# Patient Record
Sex: Male | Born: 1960
Health system: Southern US, Community
[De-identification: ages and names within clinical notes are randomized; demographics above are authoritative.]

## PROBLEM LIST (undated history)

## (undated) DIAGNOSIS — F419 Anxiety disorder, unspecified: Secondary | ICD-10-CM

## (undated) DIAGNOSIS — M549 Dorsalgia, unspecified: Secondary | ICD-10-CM

## (undated) DIAGNOSIS — I10 Essential (primary) hypertension: Secondary | ICD-10-CM

## (undated) HISTORY — DX: Dorsalgia, unspecified: M54.9

## (undated) HISTORY — DX: Essential (primary) hypertension: I10

---

## 1984-11-21 HISTORY — PX: VASECTOMY: SHX75

## 1988-11-21 HISTORY — PX: HERNIA REPAIR: SHX51

## 2006-06-26 ENCOUNTER — Encounter: Admission: RE | Admit: 2006-06-26 | Discharge: 2006-06-26 | Payer: Self-pay | Admitting: Occupational Medicine

## 2006-11-21 DIAGNOSIS — M549 Dorsalgia, unspecified: Secondary | ICD-10-CM

## 2006-11-21 HISTORY — DX: Dorsalgia, unspecified: M54.9

## 2006-11-21 HISTORY — PX: BACK SURGERY: SHX140

## 2006-12-20 ENCOUNTER — Inpatient Hospital Stay (HOSPITAL_COMMUNITY): Admission: RE | Admit: 2006-12-20 | Discharge: 2006-12-25 | Payer: Self-pay | Admitting: Orthopedic Surgery

## 2006-12-28 ENCOUNTER — Inpatient Hospital Stay (HOSPITAL_COMMUNITY): Admission: EM | Admit: 2006-12-28 | Discharge: 2007-01-01 | Payer: Self-pay | Admitting: Emergency Medicine

## 2006-12-29 ENCOUNTER — Ambulatory Visit: Payer: Self-pay | Admitting: Internal Medicine

## 2008-08-04 IMAGING — CR DG LUMBAR SPINE 2-3V
1 series · 1 of 1 positions shown · non-contrast
Comparison: none

CLINICAL DATA: Spondylosis.  Intraoperative view.
 LUMBAR SPINE ? 1 VIEW:

[view not recorded]
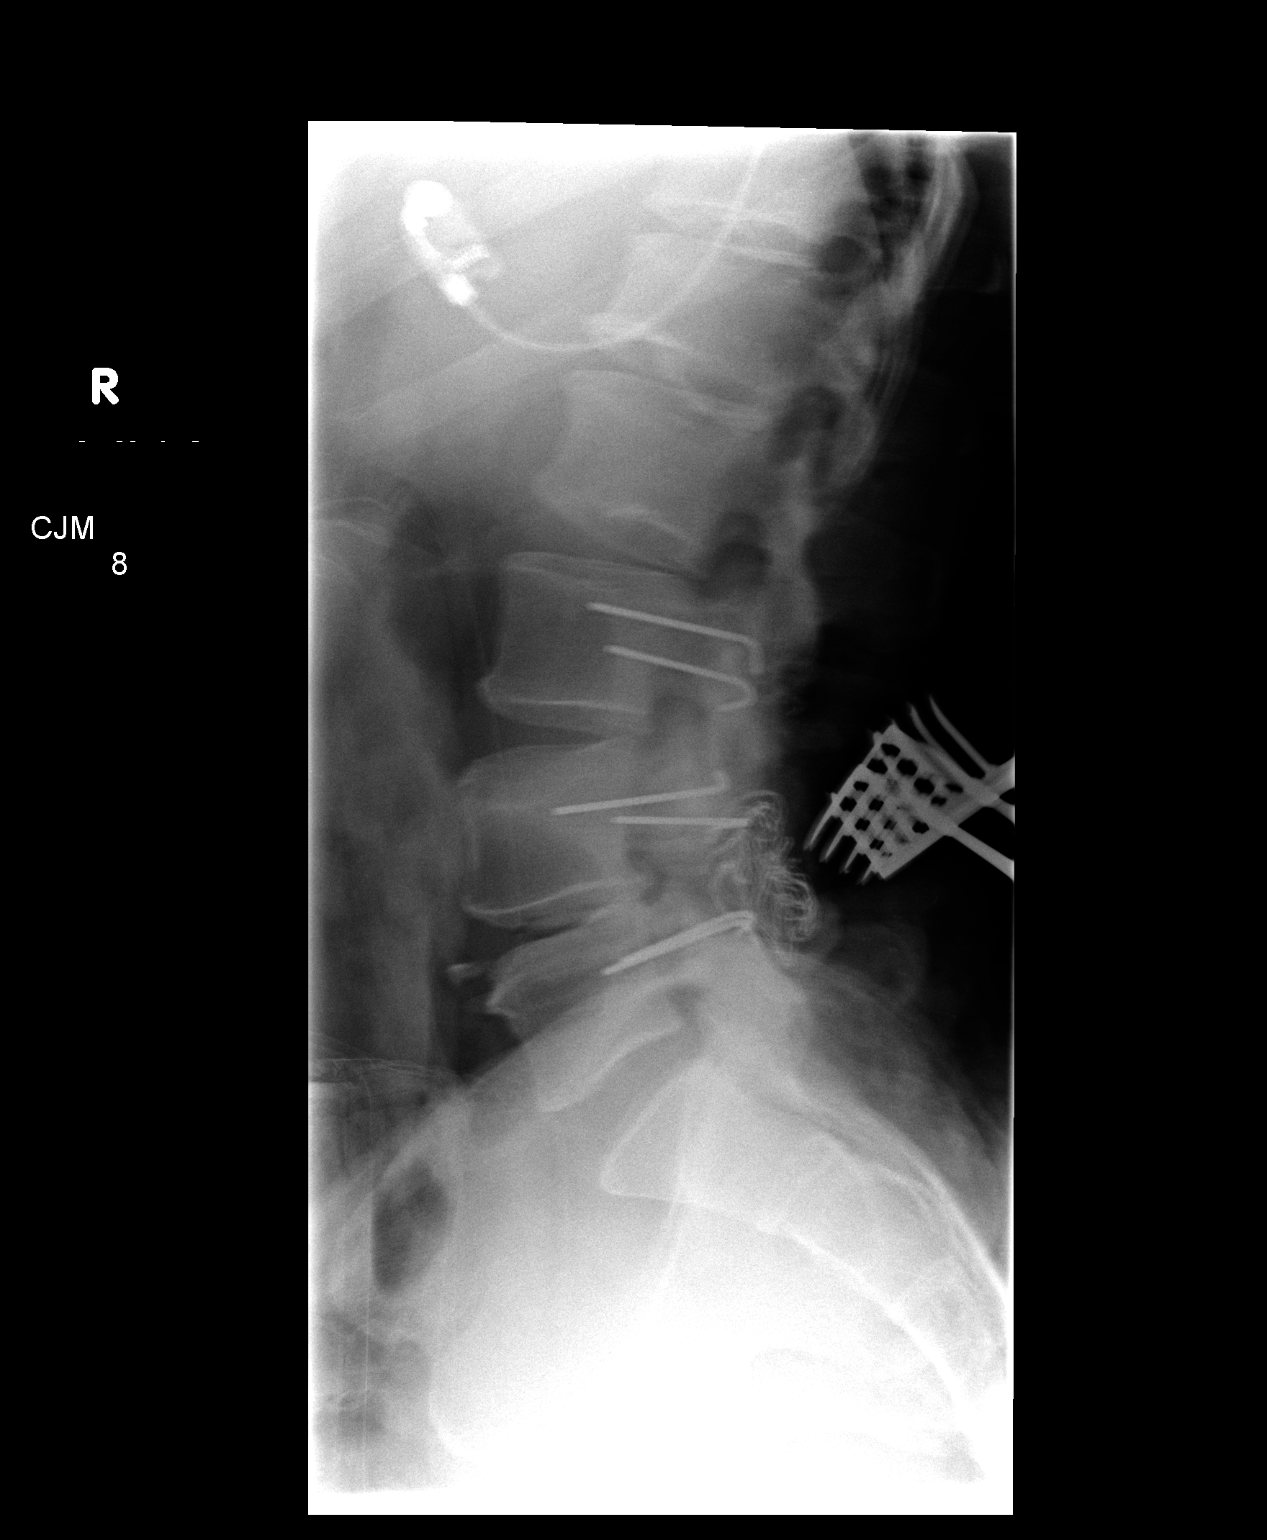

[1 of 1 positions shown; findings below may reference images not displayed]

FINDINGS: The study is interpreted, accounting for 5 non rib-bearing lumbar vertebrae.  The last measured disk was denoted as L5-S1.  Metallic marker seen posteriorly at the L4-5 disk level with skin spreaders posteriorly.
IMPRESSION: Probe at L4-5.

## 2008-08-06 IMAGING — CR DG CHEST 1V PORT
1 series · 1 of 1 positions shown · non-contrast
Comparison: none

CLINICAL DATA: Postoperative fever in patient with cervical spondylosis.
 PORTABLE CHEST - 1 VIEW:

[view not recorded]
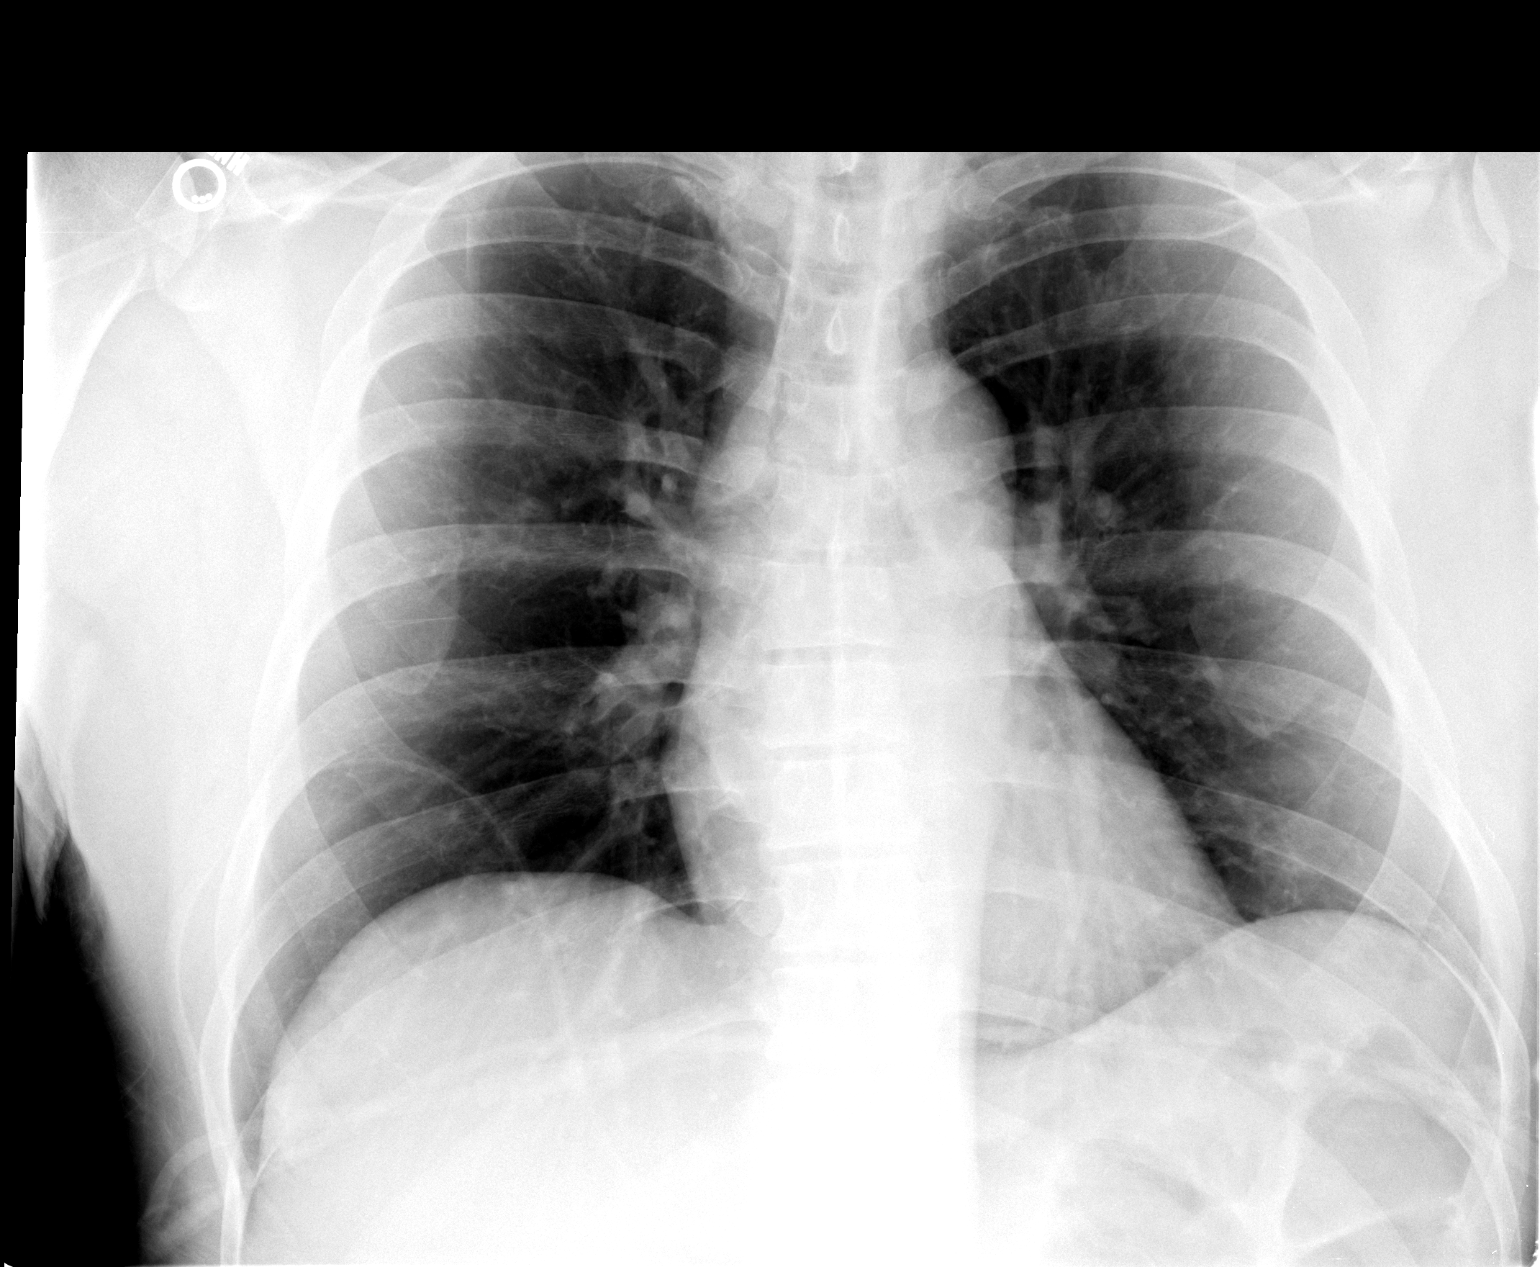

[1 of 1 positions shown; findings below may reference images not displayed]

FINDINGS: Lungs are clear.  No effusion.  Heart size normal.
IMPRESSION: No acute disease.

## 2013-04-27 DIAGNOSIS — M47817 Spondylosis without myelopathy or radiculopathy, lumbosacral region: Secondary | ICD-10-CM | POA: Insufficient documentation

## 2013-04-27 DIAGNOSIS — M5137 Other intervertebral disc degeneration, lumbosacral region: Secondary | ICD-10-CM | POA: Insufficient documentation

## 2014-02-19 LAB — HEPATIC FUNCTION PANEL
ALT: 17 U/L (ref 10–40)
AST: 17 U/L (ref 14–40)

## 2014-02-19 LAB — PSA: PSA: 0.3

## 2014-09-29 LAB — BASIC METABOLIC PANEL
BUN: 23 mg/dL — AB (ref 4–21)
CREATININE: 1.2 mg/dL (ref 0.6–1.3)
GLUCOSE: 143 mg/dL
Potassium: 4.7 mmol/L (ref 3.4–5.3)
Sodium: 138 mmol/L (ref 137–147)

## 2015-04-08 LAB — HEMOGLOBIN A1C: Hgb A1c MFr Bld: 5.6 % (ref 4.0–6.0)

## 2015-05-28 ENCOUNTER — Other Ambulatory Visit: Payer: Self-pay | Admitting: Family Medicine

## 2015-09-21 DIAGNOSIS — G8929 Other chronic pain: Secondary | ICD-10-CM | POA: Insufficient documentation

## 2015-09-21 DIAGNOSIS — F419 Anxiety disorder, unspecified: Secondary | ICD-10-CM | POA: Insufficient documentation

## 2015-09-21 DIAGNOSIS — Z8659 Personal history of other mental and behavioral disorders: Secondary | ICD-10-CM | POA: Insufficient documentation

## 2015-09-21 DIAGNOSIS — M549 Dorsalgia, unspecified: Secondary | ICD-10-CM

## 2015-09-21 DIAGNOSIS — I10 Essential (primary) hypertension: Secondary | ICD-10-CM | POA: Insufficient documentation

## 2015-09-21 DIAGNOSIS — R7303 Prediabetes: Secondary | ICD-10-CM | POA: Insufficient documentation

## 2015-09-21 DIAGNOSIS — J302 Other seasonal allergic rhinitis: Secondary | ICD-10-CM | POA: Insufficient documentation

## 2015-09-22 ENCOUNTER — Ambulatory Visit (INDEPENDENT_AMBULATORY_CARE_PROVIDER_SITE_OTHER): Payer: Medicare Other | Admitting: Family Medicine

## 2015-09-22 ENCOUNTER — Encounter: Payer: Self-pay | Admitting: Family Medicine

## 2015-09-22 VITALS — BP 120/88 | HR 64 | Temp 98.4°F | Resp 16 | Ht 68.0 in | Wt 190.0 lb

## 2015-09-22 DIAGNOSIS — G8929 Other chronic pain: Secondary | ICD-10-CM | POA: Diagnosis not present

## 2015-09-22 DIAGNOSIS — Z1211 Encounter for screening for malignant neoplasm of colon: Secondary | ICD-10-CM | POA: Diagnosis not present

## 2015-09-22 DIAGNOSIS — J302 Other seasonal allergic rhinitis: Secondary | ICD-10-CM

## 2015-09-22 DIAGNOSIS — R7303 Prediabetes: Secondary | ICD-10-CM | POA: Diagnosis not present

## 2015-09-22 DIAGNOSIS — Z0000 Encounter for general adult medical examination without abnormal findings: Secondary | ICD-10-CM

## 2015-09-22 DIAGNOSIS — M549 Dorsalgia, unspecified: Secondary | ICD-10-CM

## 2015-09-22 DIAGNOSIS — F419 Anxiety disorder, unspecified: Secondary | ICD-10-CM

## 2015-09-22 DIAGNOSIS — I10 Essential (primary) hypertension: Secondary | ICD-10-CM | POA: Diagnosis not present

## 2015-09-22 LAB — IFOBT (OCCULT BLOOD): IMMUNOLOGICAL FECAL OCCULT BLOOD TEST: NEGATIVE

## 2015-09-22 NOTE — Progress Notes (Signed)
Patient: Danny ChapelKeith E Pham, Male    DOB: [birth year], 54 y.o.   MRN: [id number] Visit Date: 09/22/2015  Today's Provider: Mila Merryonald Fisher, MD   Chief Complaint  Patient presents with  . Medicare Wellness  . Hypertension  . Anxiety   Subjective:    Annual wellness visit Danny ChapelKeith E Krontz is a 54 y.o. male. He feels well. He reports exercising yes. He reports he is sleeping well.  -----------------------------------------------------------   Follow-up for chronic anxiety from 04/08/2015; no changes. Follow-up for pre-diabetes from 04/07/2017; no changes.   Hypertension, follow-up:  BP Readings from Last 3 Encounters:  09/22/15 120/88  04/08/15 130/80    He was last seen for hypertension 5 months ago.  BP at that visit was 130/80. Management since that visit includes none. He reports good compliance with treatment. He is not having side effects. none  He is exercising. He is not adherent to low salt diet.   Outside blood pressures are 120/80. He is experiencing none.  Patient denies none.   Cardiovascular risk factors include pre-diabetes.  Use of agents associated with hypertension: none.     Weight trend: stable Wt Readings from Last 3 Encounters:  09/22/15 190 lb (86.183 kg)  04/08/15 185 lb (83.915 kg)    Current diet: well balanced  ----------------------------------------------------------------------    Review of Systems  Constitutional: Negative.   HENT: Negative.   Eyes: Negative.   Respiratory: Negative.   Cardiovascular: Negative.   Gastrointestinal: Negative.   Endocrine: Negative.   Genitourinary: Negative.   Musculoskeletal: Positive for back pain.  Skin: Negative.   Allergic/Immunologic: Negative.   Neurological: Negative.   Hematological: Negative.   Psychiatric/Behavioral: Negative.     Social History   Social History  . Marital Status: Married    Spouse Name: N/A  . Number of Children: 1  . Years of Education: N/A    Occupational History  . Disabled     Due to back pain   Social History Main Topics  . Smoking status: Never Smoker   . Smokeless tobacco: Not on file  . Alcohol Use: No  . Drug Use: No  . Sexual Activity: Not on file   Other Topics Concern  . Not on file   Social History Narrative    Patient Active Problem List   Diagnosis Date Noted  . Allergic rhinitis, seasonal 09/21/2015  . Chronic back pain 09/21/2015  . Chronic anxiety 09/21/2015  . Hypertension 09/21/2015  . Prediabetes 09/21/2015    Past Surgical History  Procedure Laterality Date  . Back surgery  2008  . Hernia repair  1980  . Vasectomy  1980    His family history includes CAD in his other; COPD in his mother; Congestive Heart Failure in his mother; Diabetes in his father and mother; Hypertension in his father and mother.    Previous Medications   ALPRAZOLAM (XANAX) 1 MG TABLET    Take 0.5-1 tablets by mouth 3 (three) times daily.   CYCLOBENZAPRINE (FLEXERIL) 10 MG TABLET    Take 0.5 tablets by mouth daily as needed.   HYDROCHLOROTHIAZIDE (HYDRODIURIL) 25 MG TABLET    TAKE 1 TABLET BY MOUTH EVERY DAY   MONTELUKAST (SINGULAIR) 10 MG TABLET    Take 1 tablet by mouth daily. For allergies   MORPHINE (MS CONTIN) 30 MG 12 HR TABLET    Take 1 tablet by mouth every 12 (twelve) hours.   OXYCODONE-ACETAMINOPHEN (PERCOCET) 10-325 MG TABLET    Take 1  tablet by mouth daily.   RAMIPRIL (ALTACE) 10 MG CAPSULE    Take 1 capsule by mouth daily.    Patient Care Team: Malva Limes, MD as PCP - General (Family Medicine) Malva Limes, MD as Referring Physician (Family Medicine) Ellwood Sayers, MD as Referring Physician (Physical Medicine and Rehabilitation)     Objective:   Vitals: BP 120/88 mmHg  Pulse 64  Temp(Src) 98.4 F (36.9 C) (Oral)  Resp 16  Ht  (1.727 m)  Wt 190 lb (86.183 kg)  BMI 28.90 kg/m2  SpO2 94%  Physical Exam   General Appearance:    Alert, cooperative, no distress, appears  stated age  Head:    Normocephalic, without obvious abnormality, atraumatic  Eyes:    PERRL, conjunctiva/corneas clear, EOM's intact, fundi    benign, both eyes       Ears:    Normal TM's and external ear canals, both ears  Nose:   Nares normal, septum midline, mucosa normal, no drainage   or sinus tenderness  Throat:   Lips, mucosa, and tongue normal; teeth and gums normal  Neck:   Supple, symmetrical, trachea midline, no adenopathy;       thyroid:  No enlargement/tenderness/nodules; no carotid   bruit or JVD  Back:     Symmetric, no curvature, ROM normal, no CVA tenderness  Lungs:     Clear to auscultation bilaterally, respirations unlabored  Chest wall:    No tenderness or deformity  Heart:    Regular rate and rhythm, S1 and S2 normal, no murmur, rub   or gallop  Abdomen:     Soft, non-tender, bowel sounds active all four quadrants,    no masses, no organomegaly  Genitalia:    deferred  Rectal:    normal tone, normal prostate, no masses or tenderness  Extremities:   Extremities normal, atraumatic, no cyanosis or edema  Pulses:   2+ and symmetric all extremities  Skin:   Skin color, texture, turgor normal, no rashes or lesions  Lymph nodes:   Cervical, supraclavicular, and axillary nodes normal  Neurologic:   CNII-XII intact. Normal strength, sensation and reflexes      throughout    Activities of Daily Living In your present state of health, do you have any difficulty performing the following activities: 09/22/2015  Hearing? N  Vision? N  Difficulty concentrating or making decisions? N  Walking or climbing stairs? Y  Dressing or bathing? N  Doing errands, shopping? N    Fall Risk Assessment Fall Risk  09/22/2015  Falls in the past year? No     Depression Screen PHQ 2/9 Scores 09/22/2015  PHQ - 2 Score 0  PHQ- 9 Score 0    Audit-C Alcohol Use Screening  Question Answer Points  How often do you have alcoholic drink? never 0  On days you do drink alcohol, how many  drinks do you typically consume? n/a 0  How oftey will you drink 6 or more in a total? never 0  Total Score:  0   A score of 3 or more in women, and 4 or more in men indicates increased risk for alcohol abuse, EXCEPT if all of the points are from question 1.  Results for orders performed in visit on 09/22/15  IFOBT POC (occult bld, rslt in office)  Result Value Ref Range   IFOBT Negative       Assessment & Plan:     Annual Wellness Visit  Reviewed patient's Family  Medical History Reviewed and updated list of patient's medical providers Assessment of cognitive impairment was done Assessed patient's functional ability Established a written schedule for health screening services Health Risk Assessent Completed and Reviewed  Exercise Activities and Dietary recommendations Goals    None      Immunization History  Administered Date(s) Administered  . Tdap 02/12/2013    Health Maintenance  Topic Date Due  . HIV Screening  06/30/1976  . COLONOSCOPY  iFOBT today  . INFLUENZA VACCINE  09/21/2016 (Originally 06/22/2015)  . TETANUS/TDAP  02/13/2023  . Hepatitis C Screening  Completed      Discussed health benefits of physical activity, and encouraged him to engage in regular exercise appropriate for his age and condition.    ------------------------------------------------------------------------------------------------------------ 1. Annual physical exam Doing well unremarkable exam - PSA - Lipid panel - EKG 12-Lead  2. Allergic rhinitis, seasonal Doing well with singulair  3. Chronic anxiety Doing well with prn anxiety  4. Chronic back pain Continue follow up Dr. Percell Boston  5. Essential hypertension Well controlled.  Continue current medications.    - Renal function panel - EKG 12-Lead  6. Prediabetes  - Hemoglobin A1c  7. Colon cancer screening  - IFOBT POC (occult bld, rslt in office)

## 2015-09-23 ENCOUNTER — Other Ambulatory Visit: Payer: Self-pay | Admitting: Family Medicine

## 2015-09-23 LAB — RENAL FUNCTION PANEL
ALBUMIN: 4.8 g/dL (ref 3.5–5.5)
BUN / CREAT RATIO: 14 (ref 9–20)
BUN: 13 mg/dL (ref 6–24)
CHLORIDE: 98 mmol/L (ref 97–106)
CO2: 27 mmol/L (ref 18–29)
Calcium: 9.7 mg/dL (ref 8.7–10.2)
Creatinine, Ser: 0.95 mg/dL (ref 0.76–1.27)
GFR calc non Af Amer: 90 mL/min/{1.73_m2} (ref >59)
GFR, EST AFRICAN AMERICAN: 104 mL/min/{1.73_m2} (ref >59)
GLUCOSE: 128 mg/dL — AB (ref 65–99)
POTASSIUM: 4.6 mmol/L (ref 3.5–5.2)
Phosphorus: 2.3 mg/dL — ABNORMAL LOW (ref 2.5–4.5)
Sodium: 141 mmol/L (ref 136–144)

## 2015-09-23 LAB — LIPID PANEL
Chol/HDL Ratio: 2.8 ratio units (ref 0.0–5.0)
Cholesterol, Total: 174 mg/dL (ref 100–199)
HDL: 63 mg/dL (ref >39)
LDL Calculated: 55 mg/dL (ref 0–99)
Triglycerides: 279 mg/dL — ABNORMAL HIGH (ref 0–149)
VLDL Cholesterol Cal: 56 mg/dL — ABNORMAL HIGH (ref 5–40)

## 2015-09-23 LAB — HEMOGLOBIN A1C
ESTIMATED AVERAGE GLUCOSE: 126 mg/dL
HEMOGLOBIN A1C: 6 % — AB (ref 4.8–5.6)

## 2015-09-23 LAB — PSA: Prostate Specific Ag, Serum: 0.3 ng/mL (ref 0.0–4.0)

## 2015-09-24 ENCOUNTER — Encounter: Payer: Self-pay | Admitting: Family Medicine

## 2015-10-16 ENCOUNTER — Encounter: Payer: Self-pay | Admitting: Family Medicine

## 2015-10-21 ENCOUNTER — Other Ambulatory Visit: Payer: Self-pay | Admitting: Family Medicine

## 2015-10-21 NOTE — Telephone Encounter (Signed)
Please call in alprazolam.  

## 2015-10-22 ENCOUNTER — Other Ambulatory Visit: Payer: Self-pay | Admitting: Family Medicine

## 2015-10-23 NOTE — Telephone Encounter (Signed)
Rx called in to pharmacy. 

## 2016-02-28 ENCOUNTER — Other Ambulatory Visit: Payer: Self-pay | Admitting: Family Medicine

## 2016-02-29 NOTE — Telephone Encounter (Signed)
Please call in alprazolam.  

## 2016-02-29 NOTE — Telephone Encounter (Signed)
Rx called in to pharmacy. 

## 2016-05-10 ENCOUNTER — Encounter: Payer: Self-pay | Admitting: Family Medicine

## 2016-05-10 ENCOUNTER — Ambulatory Visit (INDEPENDENT_AMBULATORY_CARE_PROVIDER_SITE_OTHER): Payer: Medicare Other | Admitting: Family Medicine

## 2016-05-10 VITALS — BP 122/78 | HR 76 | Temp 98.1°F | Resp 16 | Wt 200.0 lb

## 2016-05-10 DIAGNOSIS — F419 Anxiety disorder, unspecified: Secondary | ICD-10-CM

## 2016-05-10 DIAGNOSIS — I10 Essential (primary) hypertension: Secondary | ICD-10-CM

## 2016-05-10 NOTE — Progress Notes (Signed)
Patient: Danny Pham Male    DOB: October 23, 1961   55 y.o.   MRN: [id number] Visit Date: 05/10/2016  Today's Provider: Mila Merryonald Fisher, MD   Chief Complaint  Patient presents with  . Hypertension    follow up  . Anxiety    follow up  . Hyperglycemia    follow up   Subjective:    HPI  Hypertension, follow-up:  BP Readings from Last 3 Encounters:  09/22/15 120/88  04/08/15 130/80    He was last seen for hypertension 7 months ago.  BP at that visit was 120/88. Management since that visit includes no changes. He reports good compliance with treatment. He is not having side effects.  He is exercising. He is adherent to low salt diet.   Outside blood pressures are 120-135 systolic over 80. He is experiencing none.  Patient denies chest pain, chest pressure/discomfort, claudication, dyspnea, exertional chest pressure/discomfort, fatigue, irregular heart beat, lower extremity edema, near-syncope, orthopnea, palpitations, paroxysmal nocturnal dyspnea, syncope and tachypnea.   Cardiovascular risk factors include hypertension and male gender.  Use of agents associated with hypertension: none.     Weight trend: increasing steadily Wt Readings from Last 3 Encounters:  09/22/15 190 lb (86.183 kg)  04/08/15 185 lb (83.915 kg)    Current diet: in general, an "unhealthy" diet  ------------------------------------------------------------------------  Follow up Anxiety:  Last office visit was 7 months ago and no changes were made. Patient was advised to continue to take Alprazolam as needed, he usually only takes 1/4-1/2 tablet at a time 2-3 times most days. Patient reports good compliance with treatment, good tolerance and good symptom control.   Prediabetes, Follow-up:   Lab Results  Component Value Date   HGBA1C 6.0* 09/22/2015   HGBA1C 5.6 04/08/2015   GLUCOSE 128* 09/22/2015    Last seen for for this7 months ago.  Management since that visit includes checking  HgbA1C. No changes were made. Will recheck yearly. Current symptoms include none and have been stable.  Weight trend: increasing steadily Prior visit with dietician: no Current diet: in general, an "unhealthy" diet Current exercise: walking  Pertinent Labs:    Component Value Date/Time   CHOL 174 09/22/2015 1005   TRIG 279* 09/22/2015 1005   CHOLHDL 2.8 09/22/2015 1005   CREATININE 0.95 09/22/2015 1005   CREATININE 1.2 09/29/2014    Wt Readings from Last 3 Encounters:  09/22/15 190 lb (86.183 kg)  04/08/15 185 lb (83.915 kg)       Allergies  Allergen Reactions  . Phenergan  [Promethazine Hcl]    Current Meds  Medication Sig  . ALPRAZolam (XANAX) 1 MG tablet TAKE 1/2 TO 1 TABLET BY MOUTH 3 TIMES A DAY  . cyclobenzaprine (FLEXERIL) 10 MG tablet Take 0.5 tablets by mouth daily as needed.  . hydrochlorothiazide (HYDRODIURIL) 25 MG tablet TAKE 1 TABLET BY MOUTH EVERY DAY  . montelukast (SINGULAIR) 10 MG tablet Take 1 tablet by mouth daily. For allergies  . morphine (MS CONTIN) 30 MG 12 hr tablet Take 1 tablet by mouth every 12 (twelve) hours.  Marland Kitchen. oxyCODONE-acetaminophen (PERCOCET) 10-325 MG tablet Take 1 tablet by mouth daily.  . ramipril (ALTACE) 10 MG capsule Take 1 capsule (10 mg total) by mouth daily.    Review of Systems  Constitutional: Negative for fever, chills and appetite change.  Eyes: Negative for visual disturbance.  Respiratory: Negative for chest tightness, shortness of breath and wheezing.   Cardiovascular: Negative for chest pain and  palpitations.  Gastrointestinal: Negative for nausea, vomiting and abdominal pain.  Endocrine: Negative for cold intolerance, heat intolerance, polydipsia, polyphagia and polyuria.    Social History  Substance Use Topics  . Smoking status: Never Smoker   . Smokeless tobacco: Not on file  . Alcohol Use: No   Objective:   BP 122/78 mmHg  Pulse 76  Temp(Src) 98.1 F (36.7 C) (Oral)  Resp 16  Wt 200 lb (90.719  kg)  Physical Exam   General Appearance:    Alert, cooperative, no distress  Eyes:    PERRL, conjunctiva/corneas clear, EOM's intact       Lungs:     Clear to auscultation bilaterally, respirations unlabored  Heart:    Regular rate and rhythm  Neurologic:   Awake, alert, oriented x 3. No apparent focal neurological           defect.            Assessment & Plan:     1. Chronic anxiety Consider reducing to 0.5mg  since he is currently taking 1/4 to 1/2 tablet at a time  2. Essential hypertension Well controlled.  Continue current medications.    Return in about 5 months (around 10/10/2016).      The entirety of the information documented in the History of Present Illness, Review of Systems and Physical Exam were personally obtained by me. Portions of this information were initially documented by Awilda Bill, CMA and reviewed by me for thoroughness and accuracy.     Mila Merry, MD  [facility]

## 2016-05-31 ENCOUNTER — Other Ambulatory Visit: Payer: Self-pay

## 2016-05-31 MED ORDER — ALPRAZOLAM 1 MG PO TABS: ORAL_TABLET | ORAL | Status: DC

## 2016-05-31 NOTE — Telephone Encounter (Signed)
Pharmacy faxed refill request. 

## 2016-05-31 NOTE — Telephone Encounter (Signed)
Prescription phoned into pharmacy.

## 2016-05-31 NOTE — Telephone Encounter (Signed)
Please call in alprazolam.  

## 2016-06-05 ENCOUNTER — Other Ambulatory Visit: Payer: Self-pay | Admitting: Family Medicine

## 2016-08-31 ENCOUNTER — Other Ambulatory Visit: Payer: Self-pay | Admitting: Family Medicine

## 2016-10-18 ENCOUNTER — Ambulatory Visit (INDEPENDENT_AMBULATORY_CARE_PROVIDER_SITE_OTHER): Payer: Medicare Other | Admitting: Family Medicine

## 2016-10-18 ENCOUNTER — Encounter: Payer: Self-pay | Admitting: Family Medicine

## 2016-10-18 VITALS — BP 148/82 | HR 68 | Temp 99.0°F | Resp 16 | Ht 68.0 in | Wt 205.0 lb

## 2016-10-18 DIAGNOSIS — Z125 Encounter for screening for malignant neoplasm of prostate: Secondary | ICD-10-CM | POA: Diagnosis not present

## 2016-10-18 DIAGNOSIS — Z0000 Encounter for general adult medical examination without abnormal findings: Secondary | ICD-10-CM | POA: Diagnosis not present

## 2016-10-18 DIAGNOSIS — R7303 Prediabetes: Secondary | ICD-10-CM

## 2016-10-18 DIAGNOSIS — Z1211 Encounter for screening for malignant neoplasm of colon: Secondary | ICD-10-CM | POA: Diagnosis not present

## 2016-10-18 DIAGNOSIS — I10 Essential (primary) hypertension: Secondary | ICD-10-CM

## 2016-10-18 DIAGNOSIS — F419 Anxiety disorder, unspecified: Secondary | ICD-10-CM

## 2016-10-18 DIAGNOSIS — G2581 Restless legs syndrome: Secondary | ICD-10-CM

## 2016-10-18 LAB — IFOBT (OCCULT BLOOD): IMMUNOLOGICAL FECAL OCCULT BLOOD TEST: NEGATIVE

## 2016-10-18 NOTE — Progress Notes (Signed)
Patient: Danny ChapelKeith E Greenhalgh, Male    DOB: [birth year], 55 y.o.   MRN: [id number] Visit Date: 10/18/2016  Today's Provider: Mila Merryonald Fisher, MD   Chief Complaint  Patient presents with  . Annual Exam   Subjective:    Annual physical exam Danny ChapelKeith E Pham is a 55 y.o. male who presents today for health maintenance and complete physical. He feels well. He reports exercising no regularly. He reports he is sleeping fairly well. He reports that sometimes he has trouble sleeping due to his restless legs, but he states this is not every night.    Hypertension, follow-up:  BP Readings from Last 3 Encounters:  10/18/16 (!) 148/82  05/10/16 122/78  09/22/15 120/88    He was last seen for hypertension 5 months ago.  BP at that visit was 122/78. Management since that visit includes no changes. He reports good compliance with treatment. He is not having side effects.  He is not exercising. He is adherent to low salt diet.   Outside blood pressures are not being checked. Patient denies chest pain, exertional chest pressure/discomfort, lower extremity edema, palpitations and tachypnea.    Weight trend: stable Wt Readings from Last 3 Encounters:  10/18/16 205 lb (93 kg)  05/10/16 200 lb (90.7 kg)  09/22/15 190 lb (86.2 kg)    Current diet: well balanced    Prediabetes, Follow-up:   Lab Results  Component Value Date   HGBA1C 6.0 (H) 09/22/2015   HGBA1C 5.6 04/08/2015   GLUCOSE 128 (H) 09/22/2015    Last seen for for this1 years ago.  Management since that visit includes no changes. Current symptoms include none and have been stable.  Weight trend: stable Prior visit with dietician: no Current diet: well balanced Current exercise: none  Pertinent Labs:    Component Value Date/Time   CHOL 174 09/22/2015 1005   TRIG 279 (H) 09/22/2015 1005   CHOLHDL 2.8 09/22/2015 1005   CREATININE 0.95 09/22/2015 1005    Wt Readings from Last 3 Encounters:  10/18/16 205 lb (93  kg)  05/10/16 200 lb (90.7 kg)  09/22/15 190 lb (86.2 kg)   Chronic Anxiety, follow up:  Patient was last seen for this on 04/2016. During this visit, it was considered that he decrease Xanax 0.5mg  since he is currently taking 1/4-1/2 of the 1mg  tablet. Patient he only has 1 panic attack a week, but he does like to have the Xanax on hand.   Has been weaning off of opioids prescribed at pain clinic and having severe restless legs at night.   Review of Systems  Constitutional: Negative.   HENT: Negative.   Eyes: Negative.   Respiratory: Negative.   Cardiovascular: Negative.   Gastrointestinal: Negative.   Endocrine: Negative.   Genitourinary: Negative.   Musculoskeletal: Negative.   Skin: Negative.   Allergic/Immunologic: Negative.   Neurological: Negative.   Hematological: Negative.   Psychiatric/Behavioral: Negative.     Social History      He  reports that he has never smoked. He does not have any smokeless tobacco history on file. He reports that he does not drink alcohol or use drugs.       Social History   Social History  . Marital status: Married    Spouse name: N/A  . Number of children: 1  . Years of education: N/A   Occupational History  . Disabled     Due to back pain   Social History Main Topics  .  Smoking status: Never Smoker  . Smokeless tobacco: Not on file  . Alcohol use No  . Drug use: No  . Sexual activity: Not on file   Other Topics Concern  . Not on file   Social History Narrative  . No narrative on file    No past medical history on file.   Patient Active Problem List   Diagnosis Date Noted  . Allergic rhinitis, seasonal 09/21/2015  . Chronic back pain 09/21/2015  . Chronic anxiety 09/21/2015  . Hypertension 09/21/2015  . Prediabetes 09/21/2015  . Degeneration of intervertebral disc of lumbosacral region 04/27/2013  . Lumbosacral spondylosis 04/27/2013    Past Surgical History:  Procedure Laterality Date  . BACK SURGERY  2008    . HERNIA REPAIR  1980  . VASECTOMY  1980    Family History        Family Status  Relation Status  . Mother Deceased at age 52   CHF        His family history includes CAD in his other; COPD in his mother; Congestive Heart Failure in his mother; Diabetes in his father and mother; Hypertension in his father and mother.     Allergies  Allergen Reactions  . Phenergan  [Promethazine Hcl]      Current Outpatient Prescriptions:  .  ALPRAZolam (XANAX) 1 MG tablet, TAKE 1/2 TO 1 TABLET BY MOUTH 3 TIMES A DAY, Disp: 90 tablet, Rfl: 1 .  cyclobenzaprine (FLEXERIL) 10 MG tablet, Take 0.5 tablets by mouth daily as needed., Disp: , Rfl:  .  hydrochlorothiazide (HYDRODIURIL) 25 MG tablet, TAKE 1 TABLET BY MOUTH EVERY DAY, Disp: 30 tablet, Rfl: 5 .  montelukast (SINGULAIR) 10 MG tablet, Take 1 tablet by mouth daily. For allergies, Disp: , Rfl:  .  morphine (MS CONTIN) 30 MG 12 hr tablet, Take 1 tablet by mouth every 12 (twelve) hours., Disp: , Rfl:  .  oxyCODONE-acetaminophen (PERCOCET) 10-325 MG tablet, Take 1 tablet by mouth daily., Disp: , Rfl:  .  ramipril (ALTACE) 10 MG capsule, TAKE ONE CAPSULE BY MOUTH EVERY DAY, Disp: 30 capsule, Rfl: 11   Patient Care Team: Malva Limes, MD as PCP - General (Family Medicine) Malva Limes, MD as Referring Physician (Family Medicine) Ellwood Sayers, MD as Referring Physician (Physical Medicine and Rehabilitation)      Objective:   Vitals: BP (!) 148/82 (BP Location: Right Arm, Patient Position: Sitting, Cuff Size: Normal)   Pulse 68   Temp 99 F (37.2 C)   Resp 16   Ht 5\' 8"  (1.727 m)   Wt 205 lb (93 kg)   BMI 31.17 kg/m    Physical Exam   General Appearance:    Alert, cooperative, no distress, appears stated age  Head:    Normocephalic, without obvious abnormality, atraumatic  Eyes:    PERRL, conjunctiva/corneas clear, EOM's intact, fundi    benign, both eyes       Ears:    Normal TM's and external ear canals, both ears  Nose:    Nares normal, septum midline, mucosa normal, no drainage   or sinus tenderness  Throat:   Lips, mucosa, and tongue normal; teeth and gums normal  Neck:   Supple, symmetrical, trachea midline, no adenopathy;       thyroid:  No enlargement/tenderness/nodules; no carotid   bruit or JVD  Back:     Symmetric, no curvature, ROM normal, no CVA tenderness  Lungs:     Clear to auscultation bilaterally,  respirations unlabored  Chest wall:    No tenderness or deformity  Heart:    Regular rate and rhythm, S1 and S2 normal, no murmur, rub   or gallop  Abdomen:     Soft, non-tender, bowel sounds active all four quadrants,    no masses, no organomegaly  Genitalia:    deferred  Rectal:    normal tone, normal prostate, no masses or tenderness  Extremities:   Extremities normal, atraumatic, no cyanosis or edema  Pulses:   2+ and symmetric all extremities  Skin:   Skin color, texture, turgor normal, no rashes or lesions  Lymph nodes:   Cervical, supraclavicular, and axillary nodes normal  Neurologic:   CNII-XII intact. Normal strength, sensation and reflexes      throughout   Results for orders placed or performed in visit on 10/18/16  IFOBT POC (occult bld, rslt in office)  Result Value Ref Range   IFOBT Negative      Depression Screen PHQ 2/9 Scores 09/22/2015  PHQ - 2 Score 0  PHQ- 9 Score 0      Assessment & Plan:     Routine Health Maintenance and Physical Exam  Exercise Activities and Dietary recommendations Goals    None      Immunization History  Administered Date(s) Administered  . Tdap 02/12/2013    Health Maintenance  Topic Date Due  . HIV Screening  06/30/1976  . COLONOSCOPY  07/01/2011  . INFLUENZA VACCINE  06/21/2016  . TETANUS/TDAP  02/13/2023  . Hepatitis C Screening  Completed     Discussed health benefits of physical activity, and encouraged him to engage in regular exercise appropriate for his age and condition.      --------------------------------------------------------------------  1. Annual physical exam Generally doing well. Refused flu vaccine due to adverse reaction in the past.  - Comprehensive metabolic panel - CBC - TSH  2. Essential hypertension BP up a bit today. Usually well controlled. Continue current medications.   - Lipid panel - EKG 12-Lead  3. Prediabetes  - Hemoglobin A1c  4. Chronic anxiety Stable on current dose of alprazolam.   5. Restless leg syndrome May be withdraw el effects from opioids, or may have been masked by chronic opioids in the post.  - Ferritin - CBC - TSH  6. Colon cancer screening Refuses colonoscopy.  - IFOBT POC (occult bld, rslt in office)  7. Prostate cancer screening  - PSA   The entirety of the information documented in the History of Present Illness, Review of Systems and Physical Exam were personally obtained by me. Portions of this information were initially documented by Anson Oregonachelle Presley, CMA and reviewed by me for thoroughness and accuracy.    Mila Merryonald Fisher, MD  [facility]

## 2016-10-19 LAB — LIPID PANEL
CHOLESTEROL TOTAL: 211 mg/dL — AB (ref 100–199)
Chol/HDL Ratio: 3.1 ratio units (ref 0.0–5.0)
HDL: 69 mg/dL (ref >39)
LDL CALC: 127 mg/dL — AB (ref 0–99)
TRIGLYCERIDES: 76 mg/dL (ref 0–149)
VLDL CHOLESTEROL CAL: 15 mg/dL (ref 5–40)

## 2016-10-19 LAB — COMPREHENSIVE METABOLIC PANEL
A/G RATIO: 2.4 — AB (ref 1.2–2.2)
ALT: 34 IU/L (ref 0–44)
AST: 30 IU/L (ref 0–40)
Albumin: 4.7 g/dL (ref 3.5–5.5)
Alkaline Phosphatase: 63 IU/L (ref 39–117)
BUN/Creatinine Ratio: 20 (ref 9–20)
BUN: 19 mg/dL (ref 6–24)
Bilirubin Total: 0.8 mg/dL (ref 0.0–1.2)
CALCIUM: 9.8 mg/dL (ref 8.7–10.2)
CO2: 24 mmol/L (ref 18–29)
Chloride: 98 mmol/L (ref 96–106)
Creatinine, Ser: 0.97 mg/dL (ref 0.76–1.27)
GFR calc Af Amer: 101 mL/min/{1.73_m2} (ref >59)
GFR, EST NON AFRICAN AMERICAN: 88 mL/min/{1.73_m2} (ref >59)
GLOBULIN, TOTAL: 2 g/dL (ref 1.5–4.5)
Glucose: 128 mg/dL — ABNORMAL HIGH (ref 65–99)
POTASSIUM: 4.5 mmol/L (ref 3.5–5.2)
SODIUM: 140 mmol/L (ref 134–144)
Total Protein: 6.7 g/dL (ref 6.0–8.5)

## 2016-10-19 LAB — CBC
HEMATOCRIT: 43.2 % (ref 37.5–51.0)
HEMOGLOBIN: 14.6 g/dL (ref 12.6–17.7)
MCH: 29.6 pg (ref 26.6–33.0)
MCHC: 33.8 g/dL (ref 31.5–35.7)
MCV: 87 fL (ref 79–97)
PLATELETS: 239 10*3/uL (ref 150–379)
RBC: 4.94 x10E6/uL (ref 4.14–5.80)
RDW: 14.1 % (ref 12.3–15.4)
WBC: 8.2 10*3/uL (ref 3.4–10.8)

## 2016-10-19 LAB — TSH: TSH: 1.62 u[IU]/mL (ref 0.450–4.500)

## 2016-10-19 LAB — FERRITIN: Ferritin: 120 ng/mL (ref 30–400)

## 2016-10-19 LAB — HEMOGLOBIN A1C
ESTIMATED AVERAGE GLUCOSE: 117 mg/dL
Hgb A1c MFr Bld: 5.7 % — ABNORMAL HIGH (ref 4.8–5.6)

## 2016-10-19 LAB — PSA: Prostate Specific Ag, Serum: 0.6 ng/mL (ref 0.0–4.0)

## 2016-10-24 ENCOUNTER — Other Ambulatory Visit: Payer: Self-pay | Admitting: Family Medicine

## 2016-12-14 ENCOUNTER — Other Ambulatory Visit: Payer: Self-pay | Admitting: Family Medicine

## 2017-01-06 ENCOUNTER — Other Ambulatory Visit: Payer: Self-pay | Admitting: Family Medicine

## 2017-01-06 NOTE — Telephone Encounter (Signed)
Rx called in to pharmacy. 

## 2017-07-13 ENCOUNTER — Telehealth: Payer: Self-pay | Admitting: Family Medicine

## 2017-08-06 ENCOUNTER — Other Ambulatory Visit: Payer: Self-pay | Admitting: Family Medicine

## 2017-09-11 ENCOUNTER — Ambulatory Visit (INDEPENDENT_AMBULATORY_CARE_PROVIDER_SITE_OTHER): Payer: Medicare Other | Admitting: Family Medicine

## 2017-09-11 ENCOUNTER — Encounter: Payer: Self-pay | Admitting: Family Medicine

## 2017-09-11 VITALS — BP 140/84 | HR 74 | Temp 98.4°F | Resp 16 | Ht 68.0 in | Wt 204.0 lb

## 2017-09-11 DIAGNOSIS — K4090 Unilateral inguinal hernia, without obstruction or gangrene, not specified as recurrent: Secondary | ICD-10-CM

## 2017-09-11 NOTE — Progress Notes (Signed)
       Patient: Danny Pham Male    DOB: [birth year]   56 y.o.   MRN: [id number] Visit Date: 09/11/2017  Today's Provider: Mila Merryonald Nakyra Bourn, MD   No chief complaint on file.  Subjective:    Patient states that [place] 1989[place]. Patient states that about 1 month ago the hernia returned while he was coughing. Patient states that every time he cough he feels hernia protruding. Patient states there is no pain, just some discomfort.        Allergies  Allergen Reactions  . Phenergan  [Promethazine Hcl]      Current Outpatient Prescriptions:  .  ALPRAZolam (XANAX) 1 MG tablet, TAKE 1/2 TO 1 TABLET BY MOUTH 3 TIMES A DAY, Disp: 90 tablet, Rfl: 3 .  cyclobenzaprine (FLEXERIL) 10 MG tablet, Take 0.5 tablets by mouth daily as needed., Disp: , Rfl:  .  gabapentin (NEURONTIN) 300 MG capsule, Take 300 mg by mouth 3 (three) times daily., Disp: , Rfl:  .  hydrochlorothiazide (HYDRODIURIL) 25 MG tablet, TAKE 1 TABLET BY MOUTH EVERY DAY, Disp: 30 tablet, Rfl: 11 .  montelukast (SINGULAIR) 10 MG tablet, TAKE 1 TABLET BY MOUTH EVERY DAY FOR ALLERGIES, Disp: 30 tablet, Rfl: 12 .  oxyCODONE-acetaminophen (PERCOCET) 10-325 MG tablet, Take 1 tablet by mouth daily., Disp: , Rfl:  .  ramipril (ALTACE) 10 MG capsule, TAKE ONE CAPSULE BY MOUTH EVERY DAY, Disp: 30 capsule, Rfl: 2 .  tiZANidine (ZANAFLEX) 4 MG tablet, Take 8 mg by mouth., Disp: , Rfl:   Review of Systems  Constitutional: Negative for appetite change, chills and fever.  Respiratory: Negative for chest tightness, shortness of breath and wheezing.   Cardiovascular: Negative for chest pain and palpitations.  Gastrointestinal: Negative for abdominal pain, nausea and vomiting.    Social History  Substance Use Topics  . Smoking status: Never Smoker  . Smokeless tobacco: Never Used  . Alcohol use No   Objective:   BP 140/84 (BP Location: Right Arm, Patient Position: Sitting, Cuff Size: Large)   Pulse 74   Temp 98.4 F  (36.9 C) (Oral)   Resp 16   Ht 5\' 8"  (1.727 m)   Wt 204 lb (92.5 kg)   SpO2 98%   BMI 31.02 kg/m  Vitals:   09/11/17 0900  BP: 140/84  Pulse: 74  Resp: 16  Temp: 98.4 F (36.9 C)  TempSrc: Oral  SpO2: 98%  Weight: 204 lb (92.5 kg)  Height: 5\' 8"  (1.727 m)     Physical Exam  General Appearance:    Alert, cooperative, no distress  Eyes:    PERRL, conjunctiva/corneas clear, EOM's intact       Lungs:     Clear to auscultation bilaterally, respirations unlabored  Heart:    Regular rate and rhythm  Abdomen:   Right inguinal hernia palpated with coughing. No testicular masses. No hernia on left.         Assessment & Plan:     1. Right inguinal hernia  - Ambulatory referral to General Surgery  Patient refused flu vaccine today.       Mila Merryonald Nathin Saran, MD  [facility]

## 2017-09-20 ENCOUNTER — Ambulatory Visit (INDEPENDENT_AMBULATORY_CARE_PROVIDER_SITE_OTHER): Payer: Medicare Other | Admitting: General Surgery

## 2017-09-20 ENCOUNTER — Encounter: Payer: Self-pay | Admitting: General Surgery

## 2017-09-20 VITALS — BP 138/84 | HR 76 | Resp 14 | Ht 68.0 in | Wt 202.0 lb

## 2017-09-20 DIAGNOSIS — K4091 Unilateral inguinal hernia, without obstruction or gangrene, recurrent: Secondary | ICD-10-CM | POA: Diagnosis not present

## 2017-09-20 NOTE — Patient Instructions (Signed)
Inguinal Hernia, Adult An inguinal hernia is when fat or the intestines push through the area where the leg meets the lower belly (groin) and make a rounded lump (bulge). This condition happens over time. There are three types of inguinal hernias. These types include:  Hernias that can be pushed back into the belly (are reducible).  Hernias that cannot be pushed back into the belly (are incarcerated).  Hernias that cannot be pushed back into the belly and lose their blood supply (get strangulated). This type needs emergency surgery.  Follow these instructions at home: Lifestyle  Drink enough fluid to keep your urine (pee) clear or pale yellow.  Eat plenty of fruits, vegetables, and whole grains. These have a lot of fiber. Talk with your doctor if you have questions.  Avoid lifting heavy objects.  Avoid standing for long periods of time.  Do not use tobacco products. These include cigarettes, chewing tobacco, or e-cigarettes. If you need help quitting, ask your doctor.  Try to stay at a healthy weight. General instructions  Do not try to force the hernia back in.  Watch your hernia for any changes in color or size. Let your doctor know if there are any changes.  Take over-the-counter and prescription medicines only as told by your doctor.  Keep all follow-up visits as told by your doctor. This is important. Contact a doctor if:  You have a fever.  You have new symptoms.  Your symptoms get worse. Get help right away if:  The area where the legs meets the lower belly has: ? Pain that gets worse suddenly. ? A bulge that gets bigger suddenly and does not go down. ? A bulge that turns red or purple. ? A bulge that is painful to the touch.  You are a man and your scrotum: ? Suddenly feels painful. ? Suddenly changes in size.  You feel sick to your stomach (nauseous) and this feeling does not go away.  You throw up (vomit) and this keeps happening.  You feel your heart  beating a lot more quickly than normal.  You cannot poop (have a bowel movement) or pass gas. This information is not intended to replace advice given to you by your health care provider. Make sure you discuss any questions you have with your health care provider. Document Released: 12/08/2006 Document Revised: 04/14/2016 Document Reviewed: 09/17/2014 Elsevier Interactive Patient Education  2018 Elsevier Inc.  

## 2017-09-20 NOTE — Progress Notes (Signed)
Patient ID: Danny Pham, male   DOB: [birth year], 57 y.o.   MRN: [id number]  Chief Complaint  Patient presents with  . Hernia    HPI Danny Pham is a 56 y.o. male.  here for evaluation of a right inguinal hernia referred by Dr Sherrie Mustache. He states he has a right groin bulge that he can push back in. He states he has noticed this for about a month after a "coughing spell". He denies any gastrointestinal issues, bowels move regular.  He does go to [facility], Dr Dawayne Cirri and Marzetta Board PA for Worker Comp (back Pain). He has a nerve block scheduled for Nov 6.  HPI  Past Medical History:  Diagnosis Date  . Back pain 2008  . Hypertension     Past Surgical History:  Procedure Laterality Date  . BACK SURGERY  2008   x2  . HERNIA REPAIR Right 1990   Dr Clydie Braun  . VASECTOMY  1986    Family History  Problem Relation Age of Onset  . Diabetes Mother        type 2   . Hypertension Mother   . COPD Mother   . Congestive Heart Failure Mother   . Diabetes Father        type 2  . Hypertension Father   . CAD Other   . Colon cancer Neg Hx     Social History Social History  Substance Use Topics  . Smoking status: Never Smoker  . Smokeless tobacco: Never Used  . Alcohol use No    Allergies  Allergen Reactions  . Phenergan [Promethazine Hcl] Other (See Comments)    Restless leg    Current Outpatient Prescriptions  Medication Sig Dispense Refill  . ALPRAZolam (XANAX) 1 MG tablet TAKE 1/2 TO 1 TABLET BY MOUTH 3 TIMES A DAY 90 tablet 3  . celecoxib (CELEBREX) 200 MG capsule Take by mouth.    . gabapentin (NEURONTIN) 300 MG capsule Take 300 mg by mouth 3 (three) times daily.    . hydrochlorothiazide (HYDRODIURIL) 25 MG tablet TAKE 1 TABLET BY MOUTH EVERY DAY 30 tablet 11  . oxyCODONE-acetaminophen (PERCOCET) 10-325 MG tablet Take 1 tablet by mouth daily.    . ramipril (ALTACE) 10 MG capsule TAKE ONE CAPSULE BY MOUTH EVERY DAY 30 capsule 2  .  tiZANidine (ZANAFLEX) 4 MG tablet Take 8 mg by mouth.     No current facility-administered medications for this visit.     Review of Systems Review of Systems  Constitutional: Negative.   Respiratory: Negative.   Cardiovascular: Negative.   Gastrointestinal: Negative for constipation and diarrhea.    Blood pressure 138/84, pulse 76, resp. rate 14, height 5\' 8"  (1.727 m), weight 202 lb (91.6 kg).  Physical Exam Physical Exam  Constitutional: He is oriented to person, place, and time. He appears well-developed and well-nourished.  HENT:  Mouth/Throat: Oropharynx is clear and moist.  Eyes: Conjunctivae are normal. No scleral icterus.  Neck: Neck supple.  Cardiovascular: Normal rate, regular rhythm and normal heart sounds.   Pulmonary/Chest: Effort normal and breath sounds normal.  Abdominal: Soft. Normal appearance. A hernia is present. Hernia confirmed positive in the right inguinal area. Hernia confirmed negative in the left inguinal area.  Medium size right inguinal hernia  Genitourinary:     Lymphadenopathy:    He has no cervical adenopathy.  Neurological: He is alert and oriented to person, place, and time.  Skin: Skin is warm and dry.  Psychiatric: His behavior is normal.    Data Reviewed Laboratory studies from October 18, 2016 showed a normal CBC with a hemoglobin of 14.6 with an MCV of 87, white blood cell count of 8200, platelet count of 239,000. Conference of metabolic panel notable for a elevated blood sugar of 128.  Normal electrolytes.  Creatinine of 0.97.  Estimated GFR 88. Hemoglobin A1c: 5.7.  Assessment    Symptomatic recurrent right inguinal hernia.    Plan    Indications for elective repair reviewed.  The patient reports that he was told at the time of his original surgery no prosthetic mesh was placed.  It sounds like he had it done under local with sedation.  Indications for general anesthetic during this episode were reviewed.     Hernia  precautions and incarceration were discussed with the patient. If they develop symptoms of an incarcerated hernia, they were encouraged to seek prompt medical attention.  I have recommended repair of the hernia using mesh on an outpatient basis in the near future. The risk of infection was reviewed. The role of prosthetic mesh to minimize the risk of recurrence was reviewed.   HPI, Physical Exam, Assessment and Plan have been scribed under the direction and in the presence of Earline MayotteJeffrey W. Candence Sease, MD. Dorathy DaftMarsha Hatch, RN  I have completed the exam and reviewed the above documentation for accuracy and completeness.  I agree with the above.  Museum/gallery conservator[employer]agon [employer] has been used and any errors in dictation or transcription are unintentional.  Donnalee CurryJeffrey Chaunda Vandergriff, M.D., F.A.C.S.  [name], Tyreek Clabo W 09/20/2017, 2:26 PM  Patient's surgery has been scheduled for 10-03-17 at [facility].   Nicholes MangoMichele J. Bailey, CMA

## 2017-09-25 ENCOUNTER — Encounter
Admission: RE | Admit: 2017-09-25 | Discharge: 2017-09-25 | Disposition: A | Discharge status: Home / Self Care | Payer: Medicare Other | Source: Ambulatory Visit | Attending: General Surgery | Admitting: General Surgery

## 2017-09-25 ENCOUNTER — Other Ambulatory Visit: Payer: Self-pay

## 2017-09-25 DIAGNOSIS — I10 Essential (primary) hypertension: Secondary | ICD-10-CM | POA: Insufficient documentation

## 2017-09-25 DIAGNOSIS — Z01818 Encounter for other preprocedural examination: Secondary | ICD-10-CM | POA: Diagnosis present

## 2017-09-25 HISTORY — DX: Anxiety disorder, unspecified: F41.9

## 2017-09-25 LAB — POTASSIUM: POTASSIUM: 4 mmol/L (ref 3.5–5.1)

## 2017-09-25 NOTE — Patient Instructions (Signed)
Your procedure is scheduled on: Tuesday Nov. 13, 2018. Report to Same Day Surgery. To find out your arrival time please call [phone] between 1PM - 3PM on Monday Nov. 12, 2018.Marland Kitchen  Remember: Instructions that are not followed completely may result in serious medical risk, up to and including death, or upon the discretion of your surgeon and anesthesiologist your surgery may need to be rescheduled.     _X__ 1. Do not eat food after midnight the night before your procedure.                 No gum chewing or hard candies. You may drink clear liquids up to 2 hours                 before you are scheduled to arrive for your surgery- DO not drink clear                 liquids within 2 hours of the start of your surgery.                 Clear Liquids include:  water, apple juice without pulp, clear carbohydrate                 drink such as Clearfast of Gartorade, Black Coffee or Tea (Do not add                 anything to coffee or tea).     __ 2.  No Alcohol for 24 hours before or after surgery.   ___ 3.  Do Not Smoke or use e-cigarettes For 24 Hours Prior to Your Surgery.                 Do not use any chewable tobacco products for at least 6 hours prior to                 surgery.  ____  4.  Bring all medications with you on the day of surgery if instructed.   __x__  5.  Notify your doctor if there is any change in your medical condition      (cold, fever, infections).     Do not wear jewelry, make-up, hairpins, clips or nail polish. Do not wear lotions, powders, or perfumes. You may wear deodorant. Do not shave 48 hours prior to surgery. Men may shave face and neck. Do not bring valuables to the hospital.    [facility] is not responsible for any belongings or valuables.  Contacts, dentures or bridgework may not be worn into surgery. Leave your suitcase in the car. After surgery it may be brought to your room. For patients admitted to the hospital, discharge  time is determined by your treatment team.   Patients discharged the day of surgery will not be allowed to drive home.   Please read over the following fact sheets that you were given:   Preparing for surgery  _x___ Take these medicines the morning of surgery with A SIP OF WATER:    1. gabapentin (NEURONTIN)  2. oxyCODONE-acetaminophen (PERCOCET) optional    ____ Fleet Enema (as directed)   __x__ Use CHG Soap as directed  ____ Use inhalers on the day of surgery  ____ Stop metformin 2 days prior to surgery    ____ Take 1/2 of usual insulin dose the night before surgery. No insulin the morning          of surgery.   ____ Stop Coumadin/Plavix/aspirin on does  not apply.;  ____ Stop Anti-inflammatories on does not apply.   ____ Stop supplements until after surgery.    ____ Bring C-Pap to the hospital.

## 2017-09-27 ENCOUNTER — Other Ambulatory Visit: Payer: Self-pay

## 2017-09-29 ENCOUNTER — Telehealth: Payer: Self-pay | Admitting: Family Medicine

## 2017-10-03 ENCOUNTER — Encounter
Admission: RE | Disposition: A | Discharge status: Home / Self Care | Payer: Self-pay | Source: Ambulatory Visit | Attending: General Surgery

## 2017-10-03 ENCOUNTER — Encounter: Payer: Self-pay | Admitting: Anesthesiology

## 2017-10-03 ENCOUNTER — Ambulatory Visit: Payer: Medicare Other | Admitting: Certified Registered Nurse Anesthetist

## 2017-10-03 ENCOUNTER — Ambulatory Visit
Admission: RE | Admit: 2017-10-03 | Discharge: 2017-10-03 | Disposition: A | Discharge status: Home / Self Care | Payer: Medicare Other | Source: Ambulatory Visit | Attending: General Surgery | Admitting: General Surgery

## 2017-10-03 DIAGNOSIS — Z79899 Other long term (current) drug therapy: Secondary | ICD-10-CM | POA: Insufficient documentation

## 2017-10-03 DIAGNOSIS — I10 Essential (primary) hypertension: Secondary | ICD-10-CM | POA: Insufficient documentation

## 2017-10-03 DIAGNOSIS — K4091 Unilateral inguinal hernia, without obstruction or gangrene, recurrent: Secondary | ICD-10-CM | POA: Insufficient documentation

## 2017-10-03 HISTORY — PX: INGUINAL HERNIA REPAIR: SHX194

## 2017-10-03 SURGERY — REPAIR, HERNIA, INGUINAL, ADULT
Anesthesia: General | Site: Inguinal | Laterality: Right | Wound class: Clean

## 2017-10-03 MED ORDER — LIDOCAINE HCL (CARDIAC) 20 MG/ML IV SOLN
INTRAVENOUS | Status: DC | PRN
Start: 2017-10-03 — End: 2017-10-03
  Administered 2017-10-03: 80 mg via INTRAVENOUS

## 2017-10-03 MED ORDER — FAMOTIDINE 20 MG PO TABS
20.0000 mg | ORAL_TABLET | Freq: Once | ORAL | Status: AC
  Administered 2017-10-03: 20 mg via ORAL

## 2017-10-03 MED ORDER — DEXAMETHASONE SODIUM PHOSPHATE 10 MG/ML IJ SOLN
INTRAMUSCULAR | Status: DC | PRN
Start: 2017-10-03 — End: 2017-10-03
  Administered 2017-10-03: 10 mg via INTRAVENOUS

## 2017-10-03 MED ORDER — BUPIVACAINE-EPINEPHRINE (PF) 0.5% -1:200000 IJ SOLN
INTRAMUSCULAR | Status: DC | PRN
  Administered 2017-10-03: 10 mL via PERINEURAL
  Administered 2017-10-03: 20 mL via PERINEURAL

## 2017-10-03 MED ORDER — PHENYLEPHRINE HCL 10 MG/ML IJ SOLN
INTRAMUSCULAR | Status: DC | PRN
  Administered 2017-10-03: 100 ug via INTRAVENOUS
  Administered 2017-10-03 (×2): 50 ug via INTRAVENOUS

## 2017-10-03 MED ORDER — FENTANYL CITRATE (PF) 100 MCG/2ML IJ SOLN
INTRAMUSCULAR | Status: AC
  Filled 2017-10-03: qty 2

## 2017-10-03 MED ORDER — ONDANSETRON HCL 4 MG/2ML IJ SOLN: 4.0000 mg | Freq: Once | INTRAMUSCULAR | Status: DC | PRN

## 2017-10-03 MED ORDER — LACTATED RINGERS IV SOLN
INTRAVENOUS | Status: DC
Start: 2017-10-03 — End: 2017-10-03
  Administered 2017-10-03: 09:00:00 via INTRAVENOUS

## 2017-10-03 MED ORDER — GLYCOPYRROLATE 0.2 MG/ML IJ SOLN
INTRAMUSCULAR | Status: DC | PRN
  Administered 2017-10-03: 0.2 mg via INTRAVENOUS

## 2017-10-03 MED ORDER — CEFAZOLIN SODIUM-DEXTROSE 2-4 GM/100ML-% IV SOLN
2.0000 g | INTRAVENOUS | Status: AC
Start: 2017-10-03 — End: 2017-10-03
  Administered 2017-10-03: 2 g via INTRAVENOUS

## 2017-10-03 MED ORDER — ONDANSETRON HCL 4 MG/2ML IJ SOLN
INTRAMUSCULAR | Status: DC | PRN
  Administered 2017-10-03: 4 mg via INTRAVENOUS

## 2017-10-03 MED ORDER — ACETAMINOPHEN 10 MG/ML IV SOLN
INTRAVENOUS | Status: DC | PRN
  Administered 2017-10-03: 1000 mg via INTRAVENOUS

## 2017-10-03 MED ORDER — FENTANYL CITRATE (PF) 100 MCG/2ML IJ SOLN: 25.0000 ug | INTRAMUSCULAR | Status: DC | PRN

## 2017-10-03 MED ORDER — BUPIVACAINE-EPINEPHRINE (PF) 0.5% -1:200000 IJ SOLN
INTRAMUSCULAR | Status: AC
  Filled 2017-10-03: qty 30

## 2017-10-03 MED ORDER — PROPOFOL 10 MG/ML IV BOLUS
INTRAVENOUS | Status: AC
  Filled 2017-10-03: qty 20

## 2017-10-03 MED ORDER — CEFAZOLIN SODIUM-DEXTROSE 2-4 GM/100ML-% IV SOLN
INTRAVENOUS | Status: AC
  Filled 2017-10-03: qty 100

## 2017-10-03 MED ORDER — LIDOCAINE HCL (PF) 2 % IJ SOLN
INTRAMUSCULAR | Status: AC
  Filled 2017-10-03: qty 10

## 2017-10-03 MED ORDER — PROPOFOL 10 MG/ML IV BOLUS
INTRAVENOUS | Status: DC | PRN
  Administered 2017-10-03: 180 mg via INTRAVENOUS

## 2017-10-03 MED ORDER — MIDAZOLAM HCL 2 MG/2ML IJ SOLN
INTRAMUSCULAR | Status: AC
  Filled 2017-10-03: qty 2

## 2017-10-03 MED ORDER — ACETAMINOPHEN 10 MG/ML IV SOLN
INTRAVENOUS | Status: AC
  Filled 2017-10-03: qty 100

## 2017-10-03 MED ORDER — FENTANYL CITRATE (PF) 100 MCG/2ML IJ SOLN
INTRAMUSCULAR | Status: DC | PRN
  Administered 2017-10-03 (×2): 25 ug via INTRAVENOUS
  Administered 2017-10-03: 50 ug via INTRAVENOUS

## 2017-10-03 MED ORDER — FAMOTIDINE 20 MG PO TABS
ORAL_TABLET | ORAL | Status: AC
  Filled 2017-10-03: qty 1

## 2017-10-03 MED ORDER — MIDAZOLAM HCL 2 MG/2ML IJ SOLN
INTRAMUSCULAR | Status: DC | PRN
  Administered 2017-10-03: 2 mg via INTRAVENOUS

## 2017-10-03 MED ORDER — HYDROCODONE-ACETAMINOPHEN 5-325 MG PO TABS: 1.0000 | ORAL_TABLET | ORAL | 0 refills | Status: DC | PRN

## 2017-10-03 SURGICAL SUPPLY — 34 items
BLADE SURG 15 STRL SS SAFETY (BLADE) ×4 IMPLANT
CANISTER SUCT 1200ML W/VALVE (MISCELLANEOUS) ×2 IMPLANT
CHLORAPREP W/TINT 26ML (MISCELLANEOUS) ×2 IMPLANT
DECANTER SPIKE VIAL GLASS SM (MISCELLANEOUS) ×2 IMPLANT
DRAIN PENROSE 1/4X12 LTX (DRAIN) ×2 IMPLANT
DRAPE LAPAROTOMY 100X77 ABD (DRAPES) ×2 IMPLANT
DRSG TEGADERM 4X4.75 (GAUZE/BANDAGES/DRESSINGS) ×2 IMPLANT
DRSG TELFA 4X3 1S NADH ST (GAUZE/BANDAGES/DRESSINGS) ×2 IMPLANT
ELECT REM PT RETURN 9FT ADLT (ELECTROSURGICAL) ×2
ELECTRODE REM PT RTRN 9FT ADLT (ELECTROSURGICAL) ×1 IMPLANT
GLOVE BIO SURGEON STRL SZ7.5 (GLOVE) ×2 IMPLANT
GLOVE INDICATOR 8.0 STRL GRN (GLOVE) ×2 IMPLANT
GOWN STRL REUS W/ TWL LRG LVL3 (GOWN DISPOSABLE) ×2 IMPLANT
GOWN STRL REUS W/TWL LRG LVL3 (GOWN DISPOSABLE) ×4
KIT RM TURNOVER STRD PROC AR (KITS) ×2 IMPLANT
LABEL OR SOLS (LABEL) ×2 IMPLANT
MESH HERNIA 6X12 ULTRAPRO MED (Mesh General) ×1 IMPLANT
MESH HERNIA ULTRAPRO MED (Mesh General) ×1 IMPLANT
NDL HYPO 25X1 1.5 SAFETY (NEEDLE) ×1 IMPLANT
NDL SAFETY 22GX1.5 (NEEDLE) ×4 IMPLANT
NEEDLE HYPO 25X1 1.5 SAFETY (NEEDLE) ×2 IMPLANT
PACK BASIN MINOR ARMC (MISCELLANEOUS) ×2 IMPLANT
STRIP CLOSURE SKIN 1/2X4 (GAUZE/BANDAGES/DRESSINGS) ×2 IMPLANT
SUT SURGILON 0 BLK (SUTURE) ×4 IMPLANT
SUT VIC AB 2-0 SH 27 (SUTURE) ×2
SUT VIC AB 2-0 SH 27XBRD (SUTURE) ×1 IMPLANT
SUT VIC AB 3-0 54X BRD REEL (SUTURE) ×1 IMPLANT
SUT VIC AB 3-0 BRD 54 (SUTURE) ×2
SUT VIC AB 3-0 SH 27 (SUTURE) ×2
SUT VIC AB 3-0 SH 27X BRD (SUTURE) ×1 IMPLANT
SUT VIC AB 4-0 FS2 27 (SUTURE) ×2 IMPLANT
SWABSTK COMLB BENZOIN TINCTURE (MISCELLANEOUS) ×2 IMPLANT
SYR 3ML LL SCALE MARK (SYRINGE) ×2 IMPLANT
SYR CONTROL 10ML (SYRINGE) ×4 IMPLANT

## 2017-10-03 NOTE — Discharge Instructions (Signed)
AMBULATORY SURGERY  °DISCHARGE INSTRUCTIONS ° ° °1) The drugs that you were given will stay in your system until tomorrow so for the next 24 hours you should not: ° °A) Drive an automobile °B) Make any legal decisions °C) Drink any alcoholic beverage ° ° °2) You may resume regular meals tomorrow.  Today it is better to start with liquids and gradually work up to solid foods. ° °You may eat anything you prefer, but it is better to start with liquids, then soup and crackers, and gradually work up to solid foods. ° ° °3) Please notify your doctor immediately if you have any unusual bleeding, trouble breathing, redness and pain at the surgery site, drainage, fever, or pain not relieved by medication. ° ° ° °4) Additional Instructions: ° ° ° ° ° ° ° °Please contact your physician with any problems or Same Day Surgery at [phone], Monday through Friday 6 am to 4 pm, or [facility] [place] Main number at [phone]. °

## 2017-10-03 NOTE — Transfer of Care (Signed)
Immediate Anesthesia Transfer of Care Note  Patient: Barnetta ChapelKeith E Quach  Procedure(s) Performed: HERNIA REPAIR INGUINAL ADULT (Right Inguinal)  Patient Location: PACU  Anesthesia Type:General  Level of Consciousness: sedated  Airway & Oxygen Therapy: Patient Spontanous Breathing and Patient connected to face mask oxygen  Post-op Assessment: Report given to RN and Post -op Vital signs reviewed and stable  Post vital signs: Reviewed and stable  Last Vitals:  Vitals:   10/03/17 0835  BP: 138/90  Pulse: 74  Resp: 18  Temp: 37.4 C  SpO2: 99%    Last Pain:  Vitals:   10/03/17 0835  TempSrc: Tympanic      Patients Stated Pain Goal: 0 (10/03/17 0835)  Complications: No apparent anesthesia complications

## 2017-10-03 NOTE — Anesthesia Preprocedure Evaluation (Signed)
Anesthesia Evaluation  Patient identified by MRN, date of birth, ID band Patient awake    Reviewed: Allergy & Precautions, NPO status , Patient's Chart, lab work & pertinent test results, reviewed documented beta blocker date and time   Airway Mallampati: III  TM Distance: >3 FB     Dental  (+) Chipped   Pulmonary           Cardiovascular hypertension, Pt. on medications      Neuro/Psych Anxiety    GI/Hepatic   Endo/Other    Renal/GU      Musculoskeletal   Abdominal   Peds  Hematology   Anesthesia Other Findings   Reproductive/Obstetrics                             Anesthesia Physical Anesthesia Plan  ASA: III  Anesthesia Plan: General   Post-op Pain Management:    Induction: Intravenous  PONV Risk Score and Plan:   Airway Management Planned: LMA  Additional Equipment:   Intra-op Plan:   Post-operative Plan:   Informed Consent: I have reviewed the patients History and Physical, chart, labs and discussed the procedure including the risks, benefits and alternatives for the proposed anesthesia with the patient or authorized representative who has indicated his/her understanding and acceptance.     Plan Discussed with: CRNA  Anesthesia Plan Comments:         Anesthesia Quick Evaluation

## 2017-10-03 NOTE — Anesthesia Procedure Notes (Signed)
Procedure Name: LMA Insertion Performed by: Daruis Swaim, CRNA Pre-anesthesia Checklist: Patient identified, Patient being monitored, Timeout performed, Emergency Drugs available and Suction available Patient Re-evaluated:Patient Re-evaluated prior to induction Oxygen Delivery Method: Circle system utilized Preoxygenation: Pre-oxygenation with 100% oxygen Induction Type: IV induction Ventilation: Mask ventilation without difficulty LMA: LMA inserted LMA Size: 4.5 Tube type: Oral Number of attempts: 1 Placement Confirmation: positive ETCO2 and breath sounds checked- equal and bilateral Tube secured with: Tape Dental Injury: Teeth and Oropharynx as per pre-operative assessment        

## 2017-10-03 NOTE — H&P (Signed)
No change in clinical history or exam. For right inguinal hernia repair.  

## 2017-10-03 NOTE — Anesthesia Post-op Follow-up Note (Signed)
Anesthesia QCDR form completed.        

## 2017-10-03 NOTE — Anesthesia Postprocedure Evaluation (Signed)
Anesthesia Post Note  Patient: Danny Pham  Procedure(s) Performed: HERNIA REPAIR INGUINAL ADULT (Right Inguinal)  Patient location during evaluation: PACU Anesthesia Type: General Level of consciousness: awake and alert Pain management: pain level controlled Vital Signs Assessment: post-procedure vital signs reviewed and stable Respiratory status: spontaneous breathing, nonlabored ventilation, respiratory function stable and patient connected to nasal cannula oxygen Cardiovascular status: blood pressure returned to baseline and stable Postop Assessment: no apparent nausea or vomiting Anesthetic complications: no     Last Vitals:  Vitals:   10/03/17 1043 10/03/17 1100  BP: 115/88 (!) 130/95  Pulse: 68 (!) 58  Resp: 13 14  Temp: 36.8 C 36.4 C  SpO2: 96% 98%    Last Pain:  Vitals:   10/03/17 1100  TempSrc: Oral  PainSc:                  Syndi Pua S

## 2017-10-03 NOTE — Op Note (Signed)
Preoperative diagnosis: Recurrent right inguinal hernia.  Postoperative diagnosis: Same.  Operative procedure: Repair of recurrent right inguinal hernia with medium Ultra Pro mesh.  Operating Surgeon: Donnalee CurryJeffrey Odysseus Cada, MD.  Anesthesia: General by LMA, Marcaine 0.5% with 1-200,000 units of epinephrine, 30 cc; Toradol: 30 mg.  Estimated blood loss: Less than 5 cc.  Clinical note: This 56 year old male underwent primary repair of a right inguinal hernia in the 1980s.  He is developed a recurrence and is admitted at this time for elective repair.  He received Kefzol prior to the procedure.  Operative note: Hair was removed from the operative site prior to presentation of the operating theater.  After the induction of general anesthesia the area was cleansed with ChloraPrep and draped.  The original incision was opened and extended about 5 mm laterally.  The skin and subtendinous tissue was divided sharply and the remaining dissection completed with electrocautery.  Field block anesthesia was instilled prior to incision.  There is really minimal scarring considering the previous repair.  The external oblique was opened in the direction of its fibers.  The ilioinguinal, iliohypogastric and genitofemoral nerves were identified and protected.  The cord was mobilized and an indirect sac as well as a lipoma of the cord was appreciated.  A lipoma of the cord was excised and discarded.  The sac was mobilized circumferentially into the preperitoneal space.  After this was reduced a medium Ultra Pro mesh was smoothed into position in the preperitoneal space and the external component laid along the inguinal canal.  This was anchored to the pubic tubercle with 0 Surgilon.  The inferior border of the mesh was anchored to the inguinal ligament with interrupted 0 Surgilon sutures and these superior medial border was anchored to the transverse abdominis aponeurosis in similar fashion.  A lateral slit was made for cord  passage.  Toradol was placed in the wound.  The external oblique was closed with a running 2-0 Vicryl suture.  Scarpa's fascia was closed with a running 3-0 Vicryl suture.  The skin was closed with a running 4-0 Vicryl subcuticular suture.  Benzoin, Steri-Strips, Telfa and Tegaderm dressing were applied.  The patient tolerated the procedure well was taken to the recovery room in stable condition.

## 2017-10-18 ENCOUNTER — Ambulatory Visit (INDEPENDENT_AMBULATORY_CARE_PROVIDER_SITE_OTHER): Payer: Medicare Other | Admitting: General Surgery

## 2017-10-18 ENCOUNTER — Encounter: Payer: Self-pay | Admitting: General Surgery

## 2017-10-18 VITALS — BP 134/84 | HR 67 | Resp 14 | Ht 68.0 in | Wt 203.0 lb

## 2017-10-18 DIAGNOSIS — K4091 Unilateral inguinal hernia, without obstruction or gangrene, recurrent: Secondary | ICD-10-CM

## 2017-10-18 NOTE — Patient Instructions (Addendum)
Patient to return as needed. Proper lifting techniques reviewed.The patient is aware to call back for any questions or concerns. 

## 2017-10-18 NOTE — Progress Notes (Signed)
Patient tPatient ID: Danny Pham, male   DOB: 12-05-1960, 56 y.o.   MRN: [id number]  Chief Complaint  Patient presents with  . Routine Post Op    HPI Danny Pham is a 56 y.o. male here today for his post op right inguinal hernia repair on 10-03-17. He satates he is doing well. No gastrointestinal issues, bowels moving without difficulty.   The patient reports he required no narcotic analgesics postop. HPI  Past Medical History:  Diagnosis Date  . Anxiety   . Back pain 2008  . Hypertension     Past Surgical History:  Procedure Laterality Date  . BACK SURGERY  2008   x2  . HERNIA REPAIR Right 1990   Dr Clydie BraunBhatti  . INGUINAL HERNIA REPAIR Right 10/03/2017   Medium Ultra Pro mesh. Surgeon: Danny Pham, Danny W, MD;  Location: [facility] ORS;  Service: General;  Laterality: Right;  Marland Kitchen. VASECTOMY  1986    Family History  Problem Relation Age of Onset  . Diabetes Mother        type 2   . Hypertension Mother   . COPD Mother   . Congestive Heart Failure Mother   . Diabetes Father        type 2  . Hypertension Father   . CAD Other   . Colon cancer Neg Hx     Social History Social History   Tobacco Use  . Smoking status: Never Smoker  . Smokeless tobacco: Never Used  Substance Use Topics  . Alcohol use: No    Alcohol/week: 0.0 oz  . Drug use: No    Allergies  Allergen Reactions  . Phenergan [Promethazine Hcl] Other (See Comments)    Restless leg  . Adhesive [Tape] Rash    OK to use paper tape    Current Outpatient Medications  Medication Sig Dispense Refill  . ALPRAZolam (XANAX) 1 MG tablet TAKE 1/2 TO 1 TABLET BY MOUTH 3 TIMES A DAY (Patient taking differently: TAKE 1/2  TABLET BY MOUTH 3 TIMES A DAY AS NEEDED FOR ANXIETY) 90 tablet 3  . celecoxib (CELEBREX) 200 MG capsule Take 200 mg by mouth daily.     Marland Kitchen. gabapentin (NEURONTIN) 300 MG capsule Take 300 mg 3 (three) times daily by mouth.     . hydrochlorothiazide (HYDRODIURIL) 25 MG tablet TAKE 1 TABLET BY MOUTH  EVERY DAY 30 tablet 11  . oxyCODONE-acetaminophen (PERCOCET) 10-325 MG tablet Take 1 tablet by mouth 3 (three) times daily as needed for pain.     . ramipril (ALTACE) 10 MG capsule TAKE ONE CAPSULE BY MOUTH EVERY DAY 30 capsule 2  . tiZANidine (ZANAFLEX) 4 MG tablet Take 8 mg by mouth at bedtime as needed.      No current facility-administered medications for this visit.     Review of Systems Review of Systems  Constitutional: Negative.   Respiratory: Negative.   Cardiovascular: Negative.     Blood pressure 134/84, pulse 67, resp. rate 14, height 5\' 8"  (1.727 m), weight 203 lb (92.1 kg), SpO2 99 %.  Physical Exam Physical Exam  Constitutional: He is oriented to person, place, and time. He appears well-developed and well-nourished.  Genitourinary:     Neurological: He is alert and oriented to person, place, and time.  Skin: Skin is warm and dry.       Assessment    Doing well status post repair of a recurrent right indirect inguinal hernia.    Plan  Patient to return as needed. Proper lifting techniques reviewed. The patient is aware to call back for any questions or concerns.  HPI, Physical Exam, Assessment and Plan have been scribed under the direction and in the presence of Danny CurryJeffrey Byrnett, MD.  Danny Pham  I have completed the exam and reviewed the above documentation for accuracy and completeness.  I agree with the above.  Museum/gallery conservatorDragon [employer] has been used and any errors in dictation or transcription are unintentional.  Danny Pham, M.D., F.A.C.S.   Danny Pham, Danny Pham 10/18/2017, 8:33 PM

## 2017-10-24 ENCOUNTER — Other Ambulatory Visit: Payer: Self-pay | Admitting: Family Medicine

## 2017-11-08 NOTE — Telephone Encounter (Signed)
duplicate

## 2017-11-28 ENCOUNTER — Other Ambulatory Visit: Payer: Self-pay | Admitting: Family Medicine

## 2017-12-20 ENCOUNTER — Other Ambulatory Visit: Payer: Self-pay | Admitting: Family Medicine

## 2017-12-26 ENCOUNTER — Ambulatory Visit (INDEPENDENT_AMBULATORY_CARE_PROVIDER_SITE_OTHER): Payer: Medicare Other | Admitting: Family Medicine

## 2017-12-26 ENCOUNTER — Ambulatory Visit (INDEPENDENT_AMBULATORY_CARE_PROVIDER_SITE_OTHER): Payer: Medicare Other

## 2017-12-26 VITALS — BP 134/84 | HR 68 | Temp 98.3°F | Ht 68.0 in | Wt 203.2 lb

## 2017-12-26 DIAGNOSIS — Z0000 Encounter for general adult medical examination without abnormal findings: Secondary | ICD-10-CM

## 2017-12-26 DIAGNOSIS — Z125 Encounter for screening for malignant neoplasm of prostate: Secondary | ICD-10-CM

## 2017-12-26 DIAGNOSIS — R7303 Prediabetes: Secondary | ICD-10-CM

## 2017-12-26 DIAGNOSIS — I10 Essential (primary) hypertension: Secondary | ICD-10-CM | POA: Diagnosis not present

## 2017-12-26 DIAGNOSIS — Z1211 Encounter for screening for malignant neoplasm of colon: Secondary | ICD-10-CM

## 2017-12-26 DIAGNOSIS — J0110 Acute frontal sinusitis, unspecified: Secondary | ICD-10-CM | POA: Diagnosis not present

## 2017-12-26 DIAGNOSIS — Z136 Encounter for screening for cardiovascular disorders: Secondary | ICD-10-CM

## 2017-12-26 DIAGNOSIS — J302 Other seasonal allergic rhinitis: Secondary | ICD-10-CM

## 2017-12-26 MED ORDER — AZELASTINE HCL 0.1 % NA SOLN: 2.0000 | Freq: Two times a day (BID) | NASAL | 3 refills | Status: DC

## 2017-12-26 MED ORDER — AMOXICILLIN 500 MG PO CAPS: 1000.0000 mg | ORAL_CAPSULE | Freq: Two times a day (BID) | ORAL | 0 refills | Status: AC

## 2017-12-26 NOTE — Patient Instructions (Signed)
[name] , Thank you for taking time to come for your Medicare Wellness Visit. I appreciate your ongoing commitment to your health goals. Please review the following plan we discussed and let me know if I can assist you in the future.   Screening recommendations/referrals: Colonoscopy: FOBT due Recommended yearly ophthalmology/optometry visit for glaucoma screening and checkup Recommended yearly dental visit for hygiene and checkup  Vaccinations: Influenza vaccine: Pt declines today.  Pneumococcal vaccine: N/A Tdap vaccine: Up to date Shingles vaccine: N/A    Advanced directives: Advance directive discussed with you today. Even though you declined this today please call our office should you change your mind and we can give you the proper paperwork for you to fill out.  Conditions/risks identified: Obesity- recommend to continue current diet regimen and cutting out fats to aid in weight loss of 15 or more pounds.   Next appointment: 9:00 AM today  Preventive Care 40-64 Years, Male Preventive care refers to lifestyle choices and visits with your health care provider that can promote health and wellness. What does preventive care include?  A yearly physical exam. This is also called an annual well check.  Dental exams once or twice a year.  Routine eye exams. Ask your health care provider how often you should have your eyes checked.  Personal lifestyle choices, including:  Daily care of your teeth and gums.  Regular physical activity.  Eating a healthy diet.  Avoiding tobacco and drug use.  Limiting alcohol use.  Practicing safe sex.  Taking low-dose aspirin every day starting at age 10150. What happens during an annual well check? The services and screenings done by your health care provider during your annual well check will depend on your age, overall health, lifestyle risk factors, and family history of disease. Counseling  Your health care provider may ask you  questions about your:  Alcohol use.  Tobacco use.  Drug use.  Emotional well-being.  Home and relationship well-being.  Sexual activity.  Eating habits.  Work and work Astronome[employer]vironment. Screening  You may have the following tests or measurements:  Height, weight, and BMI.  Blood pressure.  Lipid and cholesterol levels. These may be checked every 5 years, or more frequently if you are over 57 years old.  Skin check.  Lung cancer screening. You may have this screening every year starting at age 57 if you have a 30-pack-year history of smoking and currently smoke or have quit within the past 15 years.  Fecal occult blood test (FOBT) of the stool. You may have this test every year starting at age 750.  Flexible sigmoidoscopy or colonoscopy. You may have a sigmoidoscopy every 5 years or a colonoscopy every 10 years starting at age 57.  Prostate cancer screening. Recommendations will vary depending on your family history and other risks.  Hepatitis C blood test.  Hepatitis B blood test.  Sexually transmitted disease (STD) testing.  Diabetes screening. This is done by checking your blood sugar (glucose) after you have not eaten for a while (fasting). You may have this done every 1-3 years. Discuss your test results, treatment options, and if necessary, the need for more tests with your health care provider. Vaccines  Your health care provider may recommend certain vaccines, such as:  Influenza vaccine. This is recommended every year.  Tetanus, diphtheria, and acellular pertussis (Tdap, Td) vaccine. You may need a Td booster every 10 years.  Zoster vaccine. You may need this after age 57.  Pneumococcal 13-valent conjugate (PCV13)  vaccine. You may need this if you have certain conditions and have not been vaccinated.  Pneumococcal polysaccharide (PPSV23) vaccine. You may need one or two doses if you smoke cigarettes or if you have certain conditions. Talk to your health care  provider about which screenings and vaccines you need and how often you need them. This information is not intended to replace advice given to you by your health care provider. Make sure you discuss any questions you have with your health care provider. Document Released: 12/04/2015 Document Revised: 07/27/2016 Document Reviewed: 09/08/2015 Elsevier Interactive Patient Education  2017 [place] Prevention in the Home Falls can cause injuries. They can happen to people of all ages. There are many things you can do to make your home safe and to help prevent falls. What can I do on the outside of my home?  Regularly fix the edges of walkways and driveways and fix any cracks.  Remove anything that might make you trip as you walk through a door, such as a raised step or threshold.  Trim any bushes or trees on the path to your home.  Use bright outdoor lighting.  Clear any walking paths of anything that might make someone trip, such as rocks or tools.  Regularly check to see if handrails are loose or broken. Make sure that both sides of any steps have handrails.  Any raised decks and porches should have guardrails on the edges.  Have any leaves, snow, or ice cleared regularly.  Use sand or salt on walking paths during winter.  Clean up any spills in your garage right away. This includes oil or grease spills. What can I do in the bathroom?  Use night lights.  Install grab bars by the toilet and in the tub and shower. Do not use towel bars as grab bars.  Use non-skid mats or decals in the tub or shower.  If you need to sit down in the shower, use a plastic, non-slip stool.  Keep the floor dry. Clean up any water that spills on the floor as soon as it happens.  Remove soap buildup in the tub or shower regularly.  Attach bath mats securely with double-sided non-slip rug tape.  Do not have throw rugs and other things on the floor that can make you trip. What can I do in  the bedroom?  Use night lights.  Make sure that you have a light by your bed that is easy to reach.  Do not use any sheets or blankets that are too big for your bed. They should not hang down onto the floor.  Have a firm chair that has side arms. You can use this for support while you get dressed.  Do not have throw rugs and other things on the floor that can make you trip. What can I do in the kitchen?  Clean up any spills right away.  Avoid walking on wet floors.  Keep items that you use a lot in easy-to-reach places.  If you need to reach something above you, use a strong step stool that has a grab bar.  Keep electrical cords out of the way.  Do not use floor polish or wax that makes floors slippery. If you must use wax, use non-skid floor wax.  Do not have throw rugs and other things on the floor that can make you trip. What can I do with my stairs?  Do not leave any items on the stairs.  Make sure  that there are handrails on both sides of the stairs and use them. Fix handrails that are broken or loose. Make sure that handrails are as long as the stairways.  Check any carpeting to make sure that it is firmly attached to the stairs. Fix any carpet that is loose or worn.  Avoid having throw rugs at the top or bottom of the stairs. If you do have throw rugs, attach them to the floor with carpet tape.  Make sure that you have a light switch at the top of the stairs and the bottom of the stairs. If you do not have them, ask someone to add them for you. What else can I do to help prevent falls?  Wear shoes that:  Do not have high heels.  Have rubber bottoms.  Are comfortable and fit you well.  Are closed at the toe. Do not wear sandals.  If you use a stepladder:  Make sure that it is fully opened. Do not climb a closed stepladder.  Make sure that both sides of the stepladder are locked into place.  Ask someone to hold it for you, if possible.  Clearly mark and  make sure that you can see:  Any grab bars or handrails.  First and last steps.  Where the edge of each step is.  Use tools that help you move around (mobility aids) if they are needed. These include:  Canes.  Walkers.  Scooters.  Crutches.  Turn on the lights when you go into a dark area. Replace any light bulbs as soon as they burn out.  Set up your furniture so you have a clear path. Avoid moving your furniture around.  If any of your floors are uneven, fix them.  If there are any pets around you, be aware of where they are.  Review your medicines with your doctor. Some medicines can make you feel dizzy. This can increase your chance of falling. Ask your doctor what other things that you can do to help prevent falls. This information is not intended to replace advice given to you by your health care provider. Make sure you discuss any questions you have with your health care provider. Document Released: 09/03/2009 Document Revised: 04/14/2016 Document Reviewed: 12/12/2014 Elsevier Interactive Patient Education  2017 [employer].

## 2017-12-26 NOTE — Patient Instructions (Signed)

## 2017-12-26 NOTE — Progress Notes (Signed)
Patient: Danny Pham, Male    DOB: 12-31-1960, 57 y.o.   MRN: [id number] Visit Date: 12/26/2017  Today's Provider: Mila Merry, MD   Chief Complaint  Patient presents with  . Annual Wellness Exam  . Hypertension  . Hyperlipidemia  . Hyperglycemia   Subjective:    Annual physical exam Danny Pham is a 57 y.o. male who presents today for health maintenance and complete physical. He feels well. He reports exercising not regularly. He reports he is sleeping fairly well.  OC lite- 10/18/2016. Negative. Tdap- 02/12/2013   Hypertension, follow-up:  BP Readings from Last 3 Encounters:  12/26/17 134/84  10/18/17 134/84  10/03/17 (!) 130/95    He was last seen for hypertension 1 years ago.  BP at that visit was 138/84. Management since that visit includes no changes. He reports good compliance with treatment. He is not having side effects.  He is not exercising. He is not adherent to low salt diet.   Outside blood pressures are not regularly checked. .  Weight trend: stable Wt Readings from Last 3 Encounters:  12/26/17 203 lb 3.2 oz (92.2 kg)  10/18/17 203 lb (92.1 kg)  10/03/17 200 lb (90.7 kg)    Current diet: well balanced   Lipid/Cholesterol, Follow-up:   Last seen for this1 years ago.  Management changes since that visit include no changes. . Last Lipid Panel:    Component Value Date/Time   CHOL 211 (H) 10/18/2016 0959   TRIG 76 10/18/2016 0959   HDL 69 10/18/2016 0959   CHOLHDL 3.1 10/18/2016 0959   LDLCALC 127 (H) 10/18/2016 0959    Risk factors for vascular disease include hypertension  He reports good compliance with treatment. He is not having side effects.  Current symptoms include none and have been stable. Weight trend: stable Prior visit with dietician: no   Prediabetes, Follow-up:   Lab Results  Component Value Date   HGBA1C 5.7 (H) 10/18/2016   HGBA1C 6.0 (H) 09/22/2015   HGBA1C 5.6 04/08/2015   GLUCOSE 128 (H)  10/18/2016   GLUCOSE 128 (H) 09/22/2015    Last seen for for this1 years ago.  Management since that visit includes no changes. Current symptoms include none and have been stable.  He also reports persistent nasal and sinus drainage for several months, but drainage turning dark yellow with worsening sinus pressure the last week.   Review of Systems  Constitutional: Negative.   HENT: Negative.   Eyes: Negative.   Respiratory: Negative.   Cardiovascular: Negative.   Gastrointestinal: Negative.   Endocrine: Negative.   Genitourinary: Negative.   Musculoskeletal: Negative.   Skin: Negative.   Allergic/Immunologic: Negative.   Neurological: Negative.   Hematological: Negative.   Psychiatric/Behavioral: Negative.     Social History      He  reports that  has never smoked. He has quit using smokeless tobacco. His smokeless tobacco use included chew. He reports that he does not drink alcohol or use drugs.       Social History   Socioeconomic History  . Marital status: Married    Spouse name: Not on file  . Number of children: 1  . Years of education: Not on file  . Highest education level: 12th grade  Social Needs  . Financial resource strain: Not hard at all  . Food insecurity - worry: Never true  . Food insecurity - inability: Never true  . Transportation needs - medical: No  . Transportation  needs - non-medical: No  Occupational History  . Occupation: Disabled    Comment: Due to back pain    Comment: retired  Tobacco Use  . Smoking status: Never Smoker  . Smokeless tobacco: Former Neurosurgeon    Types: Chew  . Tobacco comment: quit chewing over 20 years ago  Substance and Sexual Activity  . Alcohol use: No    Alcohol/week: 0.0 oz  . Drug use: No  . Sexual activity: Not on file  Other Topics Concern  . Not on file  Social History Narrative  . Not on file    Past Medical History:  Diagnosis Date  . Anxiety   . Back pain 2008  . Hypertension      Patient  Active Problem List   Diagnosis Date Noted  . Recurrent inguinal hernia of right side without obstruction or gangrene 09/20/2017  . Allergic rhinitis, seasonal 09/21/2015  . Chronic back pain 09/21/2015  . Chronic anxiety 09/21/2015  . Hypertension 09/21/2015  . Prediabetes 09/21/2015  . Degeneration of intervertebral disc of lumbosacral region 04/27/2013  . Lumbosacral spondylosis 04/27/2013    Past Surgical History:  Procedure Laterality Date  . BACK SURGERY  2008   x2  . HERNIA REPAIR Right 1990   Dr Clydie Braun  . INGUINAL HERNIA REPAIR Right 10/03/2017   Medium Ultra Pro mesh. Surgeon: Earline Mayotte, MD;  Location: [facility] ORS;  Service: General;  Laterality: Right;  Marland Kitchen VASECTOMY  1986    Family History        Family Status  Relation Name Status  . Mother  Deceased at age 28       CHF  . Father  Alive  . Other grandfather (Not Specified)  . Neg Hx  (Not Specified)        His family history includes CAD in his other; COPD in his mother; Congestive Heart Failure in his mother; Diabetes in his father and mother; Hypertension in his father and mother.     Allergies  Allergen Reactions  . Phenergan [Promethazine Hcl] Other (See Comments)    Restless leg  . Adhesive [Tape] Rash    OK to use paper tape     Current Outpatient Medications:  .  ALPRAZolam (XANAX) 1 MG tablet, TAKE 1/2 TO 1 TABLET BY MOUTH 3 TIMES A DAY (Patient taking differently: TAKE 1/2 TO 1 TABLET BY MOUTH 3 TIMES A DAY as needed), Disp: 90 tablet, Rfl: 4 .  Buprenorphine HCl 150 MCG FILM, Place 1 Film inside cheek 2 (two) times daily. , Disp: , Rfl:  .  celecoxib (CELEBREX) 200 MG capsule, Take 200 mg by mouth daily. , Disp: , Rfl:  .  cetirizine (ZYRTEC) 10 MG tablet, Take 10 mg by mouth daily., Disp: , Rfl:  .  Citrulline POWD, Take by mouth daily., Disp: , Rfl:  .  gabapentin (NEURONTIN) 300 MG capsule, Take 300 mg 3 (three) times daily by mouth. , Disp: , Rfl:  .  guaiFENesin 200 MG tablet, Take 200  mg by mouth every 4 (four) hours as needed for cough or to loosen phlegm., Disp: , Rfl:  .  hydrochlorothiazide (HYDRODIURIL) 25 MG tablet, TAKE 1 TABLET BY MOUTH EVERY DAY, Disp: 30 tablet, Rfl: 12 .  oxyCODONE-acetaminophen (PERCOCET) 10-325 MG tablet, Take 1 tablet by mouth 3 (three) times daily as needed for pain. , Disp: , Rfl:  .  ramipril (ALTACE) 10 MG capsule, TAKE 1 CAPSULE BY MOUTH EVERY DAY, Disp: 30 capsule, Rfl: 11 .  tiZANidine (ZANAFLEX) 4 MG tablet, Take 8 mg by mouth at bedtime as needed. , Disp: , Rfl:    Patient Care Team: Malva Limes, MD as PCP - General (Family Medicine) Marzetta Board, PA-C as Physician Assistant (Physician Assistant)      Objective:   Vitals:  BP  134/84 (BP Location: Left Arm)     Pulse  68     Temp  98.3 F (36.8 C) (Oral)     Ht  5\' 8"  (1.727 m)     Wt  203 lb 3.2 oz (92.2 kg)      BMI  30.90 kg/m      Physical Exam  General Appearance:    Alert, cooperative, no distress, appears stated age  Head:    Normocephalic, without obvious abnormality, atraumatic  Eyes:    PERRL, conjunctiva/corneas clear, EOM's intact, fundi    benign, both eyes       Ears:    Normal TM's and external ear canals, both ears  Nose:   Nares normal, septum midline, mucosa normal, no drainage   or sinus tenderness  Throat:   Lips, mucosa, and tongue normal; teeth and gums normal  Neck:   Supple, symmetrical, trachea midline, no adenopathy;       thyroid:  No enlargement/tenderness/nodules; no carotid   bruit or JVD  Back:     Symmetric, no curvature, ROM normal, no CVA tenderness  Lungs:     Clear to auscultation bilaterally, respirations unlabored  Chest wall:    No tenderness or deformity  Heart:    Regular rate and rhythm, S1 and S2 normal, no murmur, rub   or gallop  Abdomen:     Soft, non-tender, bowel sounds active all four quadrants,    no masses, no organomegaly  Genitalia:    deferred  Rectal:    deferred  Extremities:    Extremities normal, atraumatic, no cyanosis or edema  Pulses:   2+ and symmetric all extremities  Skin:   Skin color, texture, turgor normal, no rashes or lesions  Lymph nodes:   Cervical, supraclavicular, and axillary nodes normal  Neurologic:   CNII-XII intact. Normal strength, sensation and reflexes      throughout     Depression Screen PHQ 2/9 Scores 12/26/2017 10/18/2016 09/22/2015  PHQ - 2 Score 0 0 0  PHQ- 9 Score - - 0  Exception Documentation Patient refusal - -      Assessment & Plan:     Routine Health Maintenance and Physical Exam  Exercise Activities and Dietary recommendations Goals    . Weight (lb) < 185 lb (83.9 kg)     Recommend to continue current diet regimen and cutting out fats to aid in weight loss of 15 or more pounds.        Immunization History  Administered Date(s) Administered  . Tdap 02/12/2013    Health Maintenance  Topic Date Due  . HIV Screening  06/30/1976  . COLON CANCER SCREENING ANNUAL FOBT  10/18/2017  . INFLUENZA VACCINE  06/21/2018 (Originally 06/21/2017)  . TETANUS/TDAP  02/13/2023  . Hepatitis C Screening  Completed     Discussed health benefits of physical activity, and encouraged him to engage in regular exercise appropriate for his age and condition.    1. Annual physical exam  - Lipid panel  2. Essential hypertension Well controlled.   - Comprehensive metabolic panel  3. Prediabetes Counseled regarding prudent diet and regular exercise.   - Hemoglobin A1c  4.  Colon cancer screening   5. Prostate cancer screening  - PSA  6. Encounter for special screening examination for cardiovascular disorder  - Lipid panel  7. Acute frontal sinusitis, recurrence not specified  - amoxicillin (AMOXIL) 500 MG capsule; Take 2 capsules (1,000 mg total) by mouth 2 (two) times daily for 10 days.  Dispense: 40 capsule; Refill: 0  8. Seasonal allergic rhinitis, unspecified trigger  - azelastine (ASTELIN) 0.1 % nasal spray;  Place 2 sprays into both nostrils 2 (two) times daily. Use in each nostril as directed  Dispense: 30 mL; Refill: 3     Mila Merryonald Fisher, MD  [facility]

## 2017-12-26 NOTE — Progress Notes (Signed)
Subjective:   Danny Pham is a 57 y.o. male who presents for an Initial Medicare Annual Wellness Visit.  Review of Systems  N/A   Cardiac Risk Factors include: advanced age (>47men, >58 women);male gender;hypertension;obesity (BMI >30kg/m2)    Objective:    Today's Vitals   12/26/17 0833 12/26/17 0839  BP: 134/84   Pulse: 68   Temp: 98.3 F (36.8 C)   TempSrc: Oral   Weight: 203 lb 3.2 oz (92.2 kg)   Height: 5\' 8"  (1.727 m)   PainSc: 2  2   PainLoc: Back    Body mass index is 30.9 kg/m.  Advanced Directives 12/26/2017 10/03/2017 09/25/2017  Does Patient Have a Medical Advance Directive? No No No  Would patient like information on creating a medical advance directive? No - Patient declined No - Patient declined No - Patient declined    Current Medications (verified) Outpatient Encounter Medications as of 12/26/2017  Medication Sig  . ALPRAZolam (XANAX) 1 MG tablet TAKE 1/2 TO 1 TABLET BY MOUTH 3 TIMES A DAY (Patient taking differently: TAKE 1/2 TO 1 TABLET BY MOUTH 3 TIMES A DAY as needed)  . Buprenorphine HCl 150 MCG FILM Place 1 Film inside cheek 2 (two) times daily.   . celecoxib (CELEBREX) 200 MG capsule Take 200 mg by mouth daily.   . cetirizine (ZYRTEC) 10 MG tablet Take 10 mg by mouth daily.  . Citrulline POWD Take by mouth daily.  Marland Kitchen gabapentin (NEURONTIN) 300 MG capsule Take 300 mg 3 (three) times daily by mouth.   Marland Kitchen guaiFENesin 200 MG tablet Take 200 mg by mouth every 4 (four) hours as needed for cough or to loosen phlegm.  . hydrochlorothiazide (HYDRODIURIL) 25 MG tablet TAKE 1 TABLET BY MOUTH EVERY DAY  . oxyCODONE-acetaminophen (PERCOCET) 10-325 MG tablet Take 1 tablet by mouth 3 (three) times daily as needed for pain.   . ramipril (ALTACE) 10 MG capsule TAKE 1 CAPSULE BY MOUTH EVERY DAY  . tiZANidine (ZANAFLEX) 4 MG tablet Take 8 mg by mouth at bedtime as needed.    No facility-administered encounter medications on file as of 12/26/2017.     Allergies  (verified) Phenergan [promethazine hcl] and Adhesive [tape]   History: Past Medical History:  Diagnosis Date  . Anxiety   . Back pain 2008  . Hypertension    Past Surgical History:  Procedure Laterality Date  . BACK SURGERY  2008   x2  . HERNIA REPAIR Right 1990   Dr Clydie Braun  . INGUINAL HERNIA REPAIR Right 10/03/2017   Medium Ultra Pro mesh. Surgeon: Earline Mayotte, MD;  Location: [facility] ORS;  Service: General;  Laterality: Right;  Marland Kitchen VASECTOMY  1986   Family History  Problem Relation Age of Onset  . Diabetes Mother        type 2   . Hypertension Mother   . COPD Mother   . Congestive Heart Failure Mother   . Diabetes Father        type 2  . Hypertension Father   . CAD Other   . Colon cancer Neg Hx    Social History   Socioeconomic History  . Marital status: Married    Spouse name: None  . Number of children: 1  . Years of education: None  . Highest education level: 12th grade  Social Needs  . Financial resource strain: Not hard at all  . Food insecurity - worry: Never true  . Food insecurity - inability: Never true  .  Transportation needs - medical: No  . Transportation needs - non-medical: No  Occupational History  . Occupation: Disabled    Comment: Due to back pain    Comment: retired  Tobacco Use  . Smoking status: Never Smoker  . Smokeless tobacco: Former Neurosurgeon    Types: Chew  . Tobacco comment: quit chewing over 20 years ago  Substance and Sexual Activity  . Alcohol use: No    Alcohol/week: 0.0 oz  . Drug use: No  . Sexual activity: None  Other Topics Concern  . None  Social History Narrative  . None   Tobacco Counseling Counseling given: Not Answered Comment: quit chewing over 20 years ago   Clinical Intake:  Pre-visit preparation completed: Yes  Pain : 0-10 Pain Score: 2 (to 3 ) Pain Type: Chronic pain Pain Location: Back Pain Orientation: Lower Pain Descriptors / Indicators: Dull Pain Frequency: Constant     Nutritional  Status: BMI > 30  Obese Nutritional Risks: None Diabetes: No  How often do you need to have someone help you when you read instructions, pamphlets, or other written materials from your doctor or pharmacy?: 1 - Never  Interpreter Needed?: No  Information entered by :: [facility], LPN  Activities of Daily Living In your present state of health, do you have any difficulty performing the following activities: 12/26/2017 09/25/2017  Hearing? N N  Vision? N N  Comment - wears glasses  Difficulty concentrating or making decisions? N N  Walking or climbing stairs? Y Y  Comment Due to back pain. due to chronic back pain  Dressing or bathing? N -  Doing errands, shopping? N N  Preparing Food and eating ? N -  Using the Toilet? N -  In the past six months, have you accidently leaked urine? N -  Do you have problems with loss of bowel control? N -  Managing your Medications? N -  Managing your Finances? N -  Housekeeping or managing your Housekeeping? N -  Some recent data might be hidden     Immunizations and Health Maintenance Immunization History  Administered Date(s) Administered  . Tdap 02/12/2013   Health Maintenance Due  Topic Date Due  . HIV Screening  06/30/1976  . COLON CANCER SCREENING ANNUAL FOBT  10/18/2017    Patient Care Team: Malva Limes, MD as PCP - General (Family Medicine) Marzetta Board, PA-C as Physician Assistant (Physician Assistant)  Indicate any recent Medical Services you may have received from other than [facility] providers in the past year (date may be approximate).    Assessment:   This is a routine wellness examination for Danny Pham.  Hearing/Vision screen Vision Screening Comments: Pt sees Dr Precious Bard for vision checks every 1-2 years.   Dietary issues and exercise activities discussed: Current Exercise Habits: Home exercise routine, Type of exercise: walking, Time (Minutes): 10, Frequency (Times/Week): 7, Weekly Exercise (Minutes/Week): 70, Intensity:  Mild  Goals    . Weight (lb) < 185 lb (83.9 kg)     Recommend to continue current diet regimen and cutting out fats to aid in weight loss of 15 or more pounds.       Depression Screen PHQ 2/9 Scores 12/26/2017 10/18/2016 09/22/2015  PHQ - 2 Score 0 0 0  PHQ- 9 Score - - 0  Exception Documentation Patient refusal - -    Fall Risk Fall Risk  12/26/2017 10/18/2016 09/22/2015  Falls in the past year? No No No    Is the patient's home free  of loose throw rugs in walkways, pet beds, electrical cords, etc?   yes      Grab bars in the bathroom? no      Handrails on the stairs?   yes      Adequate lighting?   yes  Timed Get Up and Go performed: N/A  Cognitive Function: Pt declined screening today.       Screening Tests Health Maintenance  Topic Date Due  . HIV Screening  06/30/1976  . COLON CANCER SCREENING ANNUAL FOBT  10/18/2017  . INFLUENZA VACCINE  06/21/2018 (Originally 06/21/2017)  . TETANUS/TDAP  02/13/2023  . Hepatitis C Screening  Completed    Qualifies for Shingles Vaccine? N/A  Cancer Screenings: Lung: Low Dose CT Chest recommended if Age 72-80 years, 30 pack-year currently smoking OR have quit w/in 15years. Patient does not qualify. Colorectal: FOBT due, pt would like to have this done again.  Additional Screenings:  Hepatitis B/HIV/Syphillis: Pt declines today.  Hepatitis C Screening: Up to date     Plan:  I have personally reviewed and addressed the Medicare Annual Wellness questionnaire and have noted the following in the patient's chart:  A. Medical and social history B. Use of alcohol, tobacco or illicit drugs  C. Current medications and supplements D. Functional ability and status E.  Nutritional status F.  Physical activity G. Advance directives H. List of other physicians I.  Hospitalizations, surgeries, and ER visits in previous 12 months J.  Vitals K. Screenings such as hearing and vision if needed, cognitive and depression L. Referrals and  appointments - none  In addition, I have reviewed and discussed with patient certain preventive protocols, quality metrics, and best practice recommendations. A written personalized care plan for preventive services as well as general preventive health recommendations were provided to patient.  See attached scanned questionnaire for additional information.   Signed,  Hyacinth MeekerMckenzie Chloey Ricard, LPN Nurse Health Advisor   Nurse Recommendations: Pt needs a FOBT updated. Pt declined the HIV lab test, flu vaccine and colonoscopy order today.

## 2017-12-27 ENCOUNTER — Other Ambulatory Visit: Payer: Self-pay | Admitting: *Deleted

## 2017-12-27 ENCOUNTER — Telehealth: Payer: Self-pay

## 2017-12-27 DIAGNOSIS — Z1211 Encounter for screening for malignant neoplasm of colon: Secondary | ICD-10-CM

## 2017-12-27 LAB — HEMOGLOBIN A1C
Est. average glucose Bld gHb Est-mCnc: 128 mg/dL
Hgb A1c MFr Bld: 6.1 % — ABNORMAL HIGH (ref 4.8–5.6)

## 2017-12-27 LAB — COMPREHENSIVE METABOLIC PANEL
ALBUMIN: 4.6 g/dL (ref 3.5–5.5)
ALT: 25 IU/L (ref 0–44)
AST: 31 IU/L (ref 0–40)
Albumin/Globulin Ratio: 2.3 — ABNORMAL HIGH (ref 1.2–2.2)
Alkaline Phosphatase: 49 IU/L (ref 39–117)
BUN / CREAT RATIO: 24 — AB (ref 9–20)
BUN: 23 mg/dL (ref 6–24)
Bilirubin Total: 0.8 mg/dL (ref 0.0–1.2)
CALCIUM: 9.6 mg/dL (ref 8.7–10.2)
CHLORIDE: 100 mmol/L (ref 96–106)
CO2: 25 mmol/L (ref 20–29)
CREATININE: 0.94 mg/dL (ref 0.76–1.27)
GFR calc non Af Amer: 90 mL/min/{1.73_m2} (ref >59)
GFR, EST AFRICAN AMERICAN: 104 mL/min/{1.73_m2} (ref >59)
GLUCOSE: 122 mg/dL — AB (ref 65–99)
Globulin, Total: 2 g/dL (ref 1.5–4.5)
Potassium: 4.6 mmol/L (ref 3.5–5.2)
Sodium: 141 mmol/L (ref 134–144)
TOTAL PROTEIN: 6.6 g/dL (ref 6.0–8.5)

## 2017-12-27 LAB — LIPID PANEL
CHOL/HDL RATIO: 2 ratio (ref 0.0–5.0)
Cholesterol, Total: 159 mg/dL (ref 100–199)
HDL: 80 mg/dL (ref >39)
LDL Calculated: 69 mg/dL (ref 0–99)
Triglycerides: 48 mg/dL (ref 0–149)
VLDL Cholesterol Cal: 10 mg/dL (ref 5–40)

## 2017-12-27 LAB — PSA: Prostate Specific Ag, Serum: 0.4 ng/mL (ref 0.0–4.0)

## 2017-12-27 LAB — IFOBT (OCCULT BLOOD): IFOBT: NEGATIVE

## 2017-12-27 NOTE — Telephone Encounter (Signed)
-----   Message from Malva Limesonald E Fisher, MD sent at 12/27/2017 10:02 AM EST ----- OC lyte is negative. Repeat yearly.

## 2017-12-27 NOTE — Telephone Encounter (Signed)
Pt's wife Mrs. Rocks (Per DPR) advisWendi Mayaed.  She states Mr. Wendi Mayatwater will call and schedule a follow up visit.   Thanks,   -Vernona RiegerLaura

## 2017-12-27 NOTE — Telephone Encounter (Signed)
-----   Message from Malva Limesonald E Fisher, MD sent at 12/27/2017 10:49 AM EST ----- Sugar is up a little bit, average is now 128, borderline for diabetes. Need to avoid sweets and starchy foods and exercise 30 minutes every day. Need to schedule follow up for diabetes in 6 months.  Rest of labs are normal.

## 2018-11-06 ENCOUNTER — Other Ambulatory Visit: Payer: Self-pay | Admitting: Family Medicine

## 2018-12-20 ENCOUNTER — Telehealth: Payer: Self-pay

## 2018-12-20 NOTE — Telephone Encounter (Signed)
LMTCB and schedule AWV for this year. -MM 

## 2019-02-04 NOTE — Telephone Encounter (Signed)
Scheduled AWV for 03/15/19 @ 9:20 AM.

## 2019-02-21 ENCOUNTER — Encounter: Payer: Self-pay | Admitting: Family Medicine

## 2019-02-21 ENCOUNTER — Ambulatory Visit: Payer: Self-pay

## 2019-03-15 ENCOUNTER — Ambulatory Visit: Payer: Self-pay

## 2019-03-15 ENCOUNTER — Encounter: Payer: Self-pay | Admitting: Family Medicine

## 2019-05-29 ENCOUNTER — Encounter: Payer: Self-pay | Admitting: Family Medicine

## 2019-06-21 ENCOUNTER — Other Ambulatory Visit: Payer: Self-pay | Admitting: Family Medicine

## 2019-08-13 ENCOUNTER — Telehealth: Payer: Self-pay

## 2019-08-13 NOTE — Telephone Encounter (Signed)
LMTCB to inquire about telephonic AWV.

## 2019-08-22 NOTE — Telephone Encounter (Signed)
Scheduled AWV for 08/28/19 @ 9:00 AM.

## 2019-08-27 NOTE — Progress Notes (Signed)
Subjective:   Danny Pham is a 58 y.o. male who presents for Medicare Annual/Subsequent preventive examination.    This visit is being conducted through telemedicine due to the COVID-19 pandemic. This patient has given me verbal consent via doximity to conduct this visit, patient states they are participating from their home address. Some vital signs may be absent or patient reported.    Patient identification: identified by name, DOB, and current address  Review of Systems:  N/A  Cardiac Risk Factors include: advanced age (>4men, >10 women);hypertension;male gender     Objective:    Vitals: There were no vitals taken for this visit.  There is no height or weight on file to calculate BMI. Unable to obtain vitals due to visit being conducted via telephonically.   Advanced Directives 08/28/2019 12/26/2017 10/03/2017 09/25/2017  Does Patient Have a Medical Advance Directive? No No No No  Would patient like information on creating a medical advance directive? No - Patient declined No - Patient declined No - Patient declined No - Patient declined    Tobacco Social History   Tobacco Use  Smoking Status Never Smoker  Smokeless Tobacco Former Systems developer  . Types: Chew  Tobacco Comment   quit chewing over 20 years ago     Counseling given: Not Answered Comment: quit chewing over 20 years ago   Clinical Intake:  Pre-visit preparation completed: Yes  Pain : No/denies pain Pain Score: 0-No pain     Nutritional Risks: None Diabetes: No  How often do you need to have someone help you when you read instructions, pamphlets, or other written materials from your doctor or pharmacy?: 1 - Never  Interpreter Needed?: No  Information entered by :: [facility], LPN  Past Medical History:  Diagnosis Date  . Anxiety   . Back pain 2008  . Hypertension    Past Surgical History:  Procedure Laterality Date  . BACK SURGERY  2008   x2  . HERNIA REPAIR Right 1990   Dr Emilio Math  .  INGUINAL HERNIA REPAIR Right 10/03/2017   Medium Ultra Pro mesh. Surgeon: Robert Bellow, MD;  Location: [facility] ORS;  Service: General;  Laterality: Right;  Marland Kitchen VASECTOMY  1986   Family History  Problem Relation Age of Onset  . Diabetes Mother        type 2   . Hypertension Mother   . COPD Mother   . Congestive Heart Failure Mother   . Diabetes Father        type 2  . Hypertension Father   . Dementia Father   . CAD Other   . Colon cancer Neg Hx    Social History   Socioeconomic History  . Marital status: Married    Spouse name: Not on file  . Number of children: 1  . Years of education: Not on file  . Highest education level: 12th grade  Occupational History  . Occupation: Disabled    Comment: Due to back pain    Comment: retired  Scientific laboratory technician  . Financial resource strain: Not hard at all  . Food insecurity    Worry: Never true    Inability: Never true  . Transportation needs    Medical: No    Non-medical: No  Tobacco Use  . Smoking status: Never Smoker  . Smokeless tobacco: Former Systems developer    Types: Chew  . Tobacco comment: quit chewing over 20 years ago  Substance and Sexual Activity  . Alcohol use: No  Alcohol/week: 0.0 standard drinks  . Drug use: No  . Sexual activity: Not on file  Lifestyle  . Physical activity    Days per week: 0 days    Minutes per session: 0 min  . Stress: Only a little  Relationships  . Social Musicianconnections    Talks on phone: Patient refused    Gets together: Patient refused    Attends religious service: Patient refused    Active member of club or organization: Patient refused    Attends meetings of clubs or organizations: Patient refused    Relationship status: Patient refused  Other Topics Concern  . Not on file  Social History Narrative  . Not on file    Outpatient Encounter Medications as of 08/28/2019  Medication Sig  . Buprenorphine HCl 150 MCG FILM Place 1 Film inside cheek 2 (two) times daily.   . celecoxib (CELEBREX)  200 MG capsule Take 200 mg by mouth daily. As needed  . Citrulline POWD Take by mouth daily.  Marland Kitchen. gabapentin (NEURONTIN) 300 MG capsule Take 300 mg 3 (three) times daily by mouth.   Marland Kitchen. guaiFENesin 200 MG tablet Take 200 mg by mouth every 4 (four) hours as needed for cough or to loosen phlegm.  . hydrochlorothiazide (HYDRODIURIL) 25 MG tablet TAKE 1 TABLET BY MOUTH EVERY DAY  . Multiple Vitamin (MULTIVITAMIN) tablet Take 1 tablet by mouth daily.  . ramipril (ALTACE) 10 MG capsule TAKE 1 CAPSULE BY MOUTH EVERY DAY  . tiZANidine (ZANAFLEX) 4 MG tablet Take 8 mg by mouth at bedtime as needed.   . ALPRAZolam (XANAX) 1 MG tablet TAKE 1/2 TO 1 TABLET BY MOUTH 3 TIMES A DAY (Patient not taking: Reported on 08/28/2019)  . azelastine (ASTELIN) 0.1 % nasal spray Place 2 sprays into both nostrils 2 (two) times daily. Use in each nostril as directed (Patient not taking: Reported on 08/28/2019)  . cetirizine (ZYRTEC) 10 MG tablet Take 10 mg by mouth daily.  Marland Kitchen. oxyCODONE-acetaminophen (PERCOCET) 10-325 MG tablet Take 1 tablet by mouth 3 (three) times daily as needed for pain.    No facility-administered encounter medications on file as of 08/28/2019.     Activities of Daily Living In your present state of health, do you have any difficulty performing the following activities: 08/28/2019  Hearing? N  Vision? N  Difficulty concentrating or making decisions? N  Walking or climbing stairs? Y  Comment Due to back pain.  Dressing or bathing? N  Doing errands, shopping? N  Preparing Food and eating ? N  Using the Toilet? N  In the past six months, have you accidently leaked urine? N  Do you have problems with loss of bowel control? N  Managing your Medications? N  Managing your Finances? N  Housekeeping or managing your Housekeeping? Y  Comment Does not clean due to back pain.  Some recent data might be hidden    Patient Care Team: Malva LimesFisher, Donald E, MD as PCP - General (Family Medicine) Kizzie BaneKeilitz, Jane, PA-C  as Physician Assistant (Physician Assistant)   Assessment:   This is a routine wellness examination for [name].  Exercise Activities and Dietary recommendations Current Exercise Habits: The patient does not participate in regular exercise at present, Exercise limited by: orthopedic condition(s)  Goals    . Weight (lb) < 185 lb (83.9 kg)     Recommend to continue current diet regimen and cutting out fats to aid in weight loss of 15 or more pounds.  Fall Risk: Fall Risk  08/28/2019 12/26/2017 10/18/2016 09/22/2015  Falls in the past year? 0 No No No  Number falls in past yr: 0 - - -  Injury with Fall? 0 - - -    FALL RISK PREVENTION PERTAINING TO THE HOME:  Any stairs in or around the home? Yes  If so, are there any without handrails? No   Home free of loose throw rugs in walkways, pet beds, electrical cords, etc? Yes  Adequate lighting in your home to reduce risk of falls? Yes   ASSISTIVE DEVICES UTILIZED TO PREVENT FALLS:  Life alert? No  Use of a cane, walker or w/c? No  Grab bars in the bathroom? No  Shower chair or bench in shower? Yes  Elevated toilet seat or a handicapped toilet? No   TIMED UP AND GO:  Was the test performed? No .    Depression Screen PHQ 2/9 Scores 08/28/2019 12/26/2017 10/18/2016 09/22/2015  PHQ - 2 Score 0 0 0 0  PHQ- 9 Score - - - 0  Exception Documentation - Patient refusal - -    Cognitive Function: Declined today.         Immunization History  Administered Date(s) Administered  . Tdap 02/12/2013    Qualifies for Shingles Vaccine? No  Tdap: Up to date  Flu Vaccine: Due for Flu vaccine. Does the patient want to receive this vaccine today?  No .    Screening Tests Health Maintenance  Topic Date Due  . COLONOSCOPY  07/01/2011  . COLON CANCER SCREENING ANNUAL FOBT  12/27/2018  . INFLUENZA VACCINE  02/19/2020 (Originally 06/22/2019)  . HIV Screening  08/27/2020 (Originally 06/30/1976)  . TETANUS/TDAP  02/13/2023  . Hepatitis C  Screening  Completed   Cancer Screenings:  Colorectal Screening: Completed cologuard per pt. Requested records to be sent to clinic.  Lung Cancer Screening: (Low Dose CT Chest recommended if Age 73-80 years, 30 pack-year currently smoking OR have quit w/in 15years.) does not qualify.   Additional Screening:  Hepatitis C Screening: Up to date  Vision Screening: Recommended annual ophthalmology exams for early detection of glaucoma and other disorders of the eye.  Dental Screening: Recommended annual dental exams for proper oral hygiene  Community Resource Referral:  CRR required this visit?  No        Plan:  I have personally reviewed and addressed the Medicare Annual Wellness questionnaire and have noted the following in the patient's chart:  A. Medical and social history B. Use of alcohol, tobacco or illicit drugs  C. Current medications and supplements D. Functional ability and status E.  Nutritional status F.  Physical activity G. Advance directives H. List of other physicians I.  Hospitalizations, surgeries, and ER visits in previous 12 months J.  Vitals K. Screenings such as hearing and vision if needed, cognitive and depression L. Referrals and appointments   In addition, I have reviewed and discussed with patient certain preventive protocols, quality metrics, and best practice recommendations. A written personalized care plan for preventive services as well as general preventive health recommendations were provided to patient.   Darrick Huntsman, LPN  [id number] Nurse Health Advisor   Nurse Notes: Requested cologuard results. Declined a future HIV lab order.

## 2019-08-28 ENCOUNTER — Ambulatory Visit (INDEPENDENT_AMBULATORY_CARE_PROVIDER_SITE_OTHER): Payer: Medicare Other

## 2019-08-28 ENCOUNTER — Telehealth: Payer: Self-pay | Admitting: Family Medicine

## 2019-08-28 ENCOUNTER — Other Ambulatory Visit: Payer: Self-pay

## 2019-08-28 DIAGNOSIS — Z0000 Encounter for general adult medical examination without abnormal findings: Secondary | ICD-10-CM

## 2019-08-28 NOTE — Telephone Encounter (Signed)
Pt needing a call back regarding his stool sample that was mailed SEP 14th to [facility]. He hasn't heard back on his results. Pt is very frustrated because he hasn't been told results.  Please call pt back at [phone].  Thanks, [employer]

## 2019-08-28 NOTE — Telephone Encounter (Signed)
I don't see where you have order stool test recently.  Please advise.   Thanks,   -Mickel Baas

## 2019-08-28 NOTE — Patient Instructions (Addendum)
Danny Pham , Thank you for taking time to come for your Medicare Wellness Visit. I appreciate your ongoing commitment to your health goals. Please review the following plan we discussed and let me know if I can assist you in the future.   Screening recommendations/referrals: Colonoscopy: Completed cologuard through insurance. Unsure results and will call back once he retrieves those.  Recommended yearly ophthalmology/optometry visit for glaucoma screening and checkup Recommended yearly dental visit for hygiene and checkup  Vaccinations: Influenza vaccine: Currently due Tdap vaccine: Up to date, due 01/2023    Advanced directives: Advance directive discussed with you today. Even though you declined this today please call our office should you change your mind and we can give you the proper paperwork for you to fill out.  Conditions/risks identified: Continue current diet plan of cutting out sugars to help maintain a healthy weight.   Next appointment: 12/02/18 @ 9:00 AM with Dr Caryn Section. Declined scheduling an AWV for 2021 at this time.   Preventive Care 40-64 Years, Male Preventive care refers to lifestyle choices and visits with your health care provider that can promote health and wellness. What does preventive care include?  A yearly physical exam. This is also called an annual well check.  Dental exams once or twice a year.  Routine eye exams. Ask your health care provider how often you should have your eyes checked.  Personal lifestyle choices, including:  Daily care of your teeth and gums.  Regular physical activity.  Eating a healthy diet.  Avoiding tobacco and drug use.  Limiting alcohol use.  Practicing safe sex.  Taking low-dose aspirin every day starting at age 66. What happens during an annual well check? The services and screenings done by your health care provider during your annual well check will depend on your age, overall health, lifestyle risk factors, and  family history of disease. Counseling  Your health care provider may ask you questions about your:  Alcohol use.  Tobacco use.  Drug use.  Emotional well-being.  Home and relationship well-being.  Sexual activity.  Eating habits.  Work and work Statistician. Screening  You may have the following tests or measurements:  Height, weight, and BMI.  Blood pressure.  Lipid and cholesterol levels. These may be checked every 5 years, or more frequently if you are over 51 years old.  Skin check.  Lung cancer screening. You may have this screening every year starting at age 26 if you have a 30-pack-year history of smoking and currently smoke or have quit within the past 15 years.  Fecal occult blood test (FOBT) of the stool. You may have this test every year starting at age 16.  Flexible sigmoidoscopy or colonoscopy. You may have a sigmoidoscopy every 5 years or a colonoscopy every 10 years starting at age 49.  Prostate cancer screening. Recommendations will vary depending on your family history and other risks.  Hepatitis C blood test.  Hepatitis B blood test.  Sexually transmitted disease (STD) testing.  Diabetes screening. This is done by checking your blood sugar (glucose) after you have not eaten for a while (fasting). You may have this done every 1-3 years. Discuss your test results, treatment options, and if necessary, the need for more tests with your health care provider. Vaccines  Your health care provider may recommend certain vaccines, such as:  Influenza vaccine. This is recommended every year.  Tetanus, diphtheria, and acellular pertussis (Tdap, Td) vaccine. You may need a Td booster every 10 years.  Zoster vaccine. You may need this after age 10.  Pneumococcal 13-valent conjugate (PCV13) vaccine. You may need this if you have certain conditions and have not been vaccinated.  Pneumococcal polysaccharide (PPSV23) vaccine. You may need one or two doses if you  smoke cigarettes or if you have certain conditions. Talk to your health care provider about which screenings and vaccines you need and how often you need them. This information is not intended to replace advice given to you by your health care provider. Make sure you discuss any questions you have with your health care provider. Document Released: 12/04/2015 Document Revised: 07/27/2016 Document Reviewed: 09/08/2015 Elsevier Interactive Patient Education  2017 [employer] Prevention in the Home Falls can cause injuries. They can happen to people of all ages. There are many things you can do to make your home safe and to help prevent falls. What can I do on the outside of my home?  Regularly fix the edges of walkways and driveways and fix any cracks.  Remove anything that might make you trip as you walk through a door, such as a raised step or threshold.  Trim any bushes or trees on the path to your home.  Use bright outdoor lighting.  Clear any walking paths of anything that might make someone trip, such as rocks or tools.  Regularly check to see if handrails are loose or broken. Make sure that both sides of any steps have handrails.  Any raised decks and porches should have guardrails on the edges.  Have any leaves, snow, or ice cleared regularly.  Use sand or salt on walking paths during winter.  Clean up any spills in your garage right away. This includes oil or grease spills. What can I do in the bathroom?  Use night lights.  Install grab bars by the toilet and in the tub and shower. Do not use towel bars as grab bars.  Use non-skid mats or decals in the tub or shower.  If you need to sit down in the shower, use a plastic, non-slip stool.  Keep the floor dry. Clean up any water that spills on the floor as soon as it happens.  Remove soap buildup in the tub or shower regularly.  Attach bath mats securely with double-sided non-slip rug tape.  Do not have throw  rugs and other things on the floor that can make you trip. What can I do in the bedroom?  Use night lights.  Make sure that you have a light by your bed that is easy to reach.  Do not use any sheets or blankets that are too big for your bed. They should not hang down onto the floor.  Have a firm chair that has side arms. You can use this for support while you get dressed.  Do not have throw rugs and other things on the floor that can make you trip. What can I do in the kitchen?  Clean up any spills right away.  Avoid walking on wet floors.  Keep items that you use a lot in easy-to-reach places.  If you need to reach something above you, use a strong step stool that has a grab bar.  Keep electrical cords out of the way.  Do not use floor polish or wax that makes floors slippery. If you must use wax, use non-skid floor wax.  Do not have throw rugs and other things on the floor that can make you trip. What can I do with  my stairs?  Do not leave any items on the stairs.  Make sure that there are handrails on both sides of the stairs and use them. Fix handrails that are broken or loose. Make sure that handrails are as long as the stairways.  Check any carpeting to make sure that it is firmly attached to the stairs. Fix any carpet that is loose or worn.  Avoid having throw rugs at the top or bottom of the stairs. If you do have throw rugs, attach them to the floor with carpet tape.  Make sure that you have a light switch at the top of the stairs and the bottom of the stairs. If you do not have them, ask someone to add them for you. What else can I do to help prevent falls?  Wear shoes that:  Do not have high heels.  Have rubber bottoms.  Are comfortable and fit you well.  Are closed at the toe. Do not wear sandals.  If you use a stepladder:  Make sure that it is fully opened. Do not climb a closed stepladder.  Make sure that both sides of the stepladder are locked into  place.  Ask someone to hold it for you, if possible.  Clearly mark and make sure that you can see:  Any grab bars or handrails.  First and last steps.  Where the edge of each step is.  Use tools that help you move around (mobility aids) if they are needed. These include:  Canes.  Walkers.  Scooters.  Crutches.  Turn on the lights when you go into a dark area. Replace any light bulbs as soon as they burn out.  Set up your furniture so you have a clear path. Avoid moving your furniture around.  If any of your floors are uneven, fix them.  If there are any pets around you, be aware of where they are.  Review your medicines with your doctor. Some medicines can make you feel dizzy. This can increase your chance of falling. Ask your doctor what other things that you can do to help prevent falls. This information is not intended to replace advice given to you by your health care provider. Make sure you discuss any questions you have with your health care provider. Document Released: 09/03/2009 Document Revised: 04/14/2016 Document Reviewed: 12/12/2014 Elsevier Interactive Patient Education  2017 [employer].

## 2019-08-29 NOTE — Telephone Encounter (Signed)
I have no idea what he's talking about.

## 2019-08-30 NOTE — Telephone Encounter (Signed)
[id number] 

## 2019-08-30 NOTE — Telephone Encounter (Signed)
Spoke with patient on the phone who states that his health insurance had originally mailed test to patients home to send back in to [facility]. Patient states that he believes it might have been for his yearly wellness check with his insurance company. I informed patient that he would have to contact [facility] to have report sent over, patient was also advised that if he has any further questions about lab to contact [facility]. Amparo Bristol

## 2019-11-17 ENCOUNTER — Other Ambulatory Visit: Payer: Self-pay | Admitting: Family Medicine

## 2019-11-25 ENCOUNTER — Other Ambulatory Visit: Payer: Self-pay

## 2019-11-25 ENCOUNTER — Ambulatory Visit: Payer: Medicare PPO | Admitting: Family Medicine

## 2019-11-25 ENCOUNTER — Encounter: Payer: Self-pay | Admitting: Family Medicine

## 2019-11-25 ENCOUNTER — Ambulatory Visit: Payer: Self-pay | Admitting: *Deleted

## 2019-11-25 VITALS — BP 157/89 | HR 75 | Temp 97.5°F | Resp 16 | Wt 173.0 lb

## 2019-11-25 DIAGNOSIS — N451 Epididymitis: Secondary | ICD-10-CM

## 2019-11-25 DIAGNOSIS — N410 Acute prostatitis: Secondary | ICD-10-CM | POA: Diagnosis not present

## 2019-11-25 LAB — POCT URINALYSIS DIPSTICK
Bilirubin, UA: NEGATIVE
Blood, UA: NEGATIVE
Glucose, UA: NEGATIVE
Ketones, UA: NEGATIVE
Leukocytes, UA: NEGATIVE
Nitrite, UA: NEGATIVE
Protein, UA: NEGATIVE
Spec Grav, UA: 1.015 (ref 1.010–1.025)
Urobilinogen, UA: 0.2 E.U./dL
pH, UA: 6.5 (ref 5.0–8.0)

## 2019-11-25 MED ORDER — DOXYCYCLINE HYCLATE 100 MG PO TABS: 100.0000 mg | ORAL_TABLET | Freq: Two times a day (BID) | ORAL | 0 refills | Status: DC

## 2019-11-25 NOTE — Telephone Encounter (Signed)
FYI-has appointment

## 2019-11-25 NOTE — Progress Notes (Signed)
Patient: Danny Pham Male    DOB: 05-25-1961   59 y.o.   MRN: [id number] Visit Date: 11/26/2019  Today's Provider: Shirlee Latch, MD   Chief Complaint  Patient presents with  . Urinary Tract Infection   Subjective:    I, Sulibeya S. Dimas, CMA, am acting as a Neurosurgeon for Shirlee Latch, MD.  HPI Urinary Tract Infection: Patient complains of burning with urination and dysuria He has had symptoms for 1 months. Patient also complains of testical and scrotum pain. Patient denies fever. Patient does not have a history of recurrent UTI.  Patient does not have a history of pyelonephritis.   Reports h/o prostatitis in 2019.  Was treated with Cipro and then Doxy and it cleared up.  This feels similar.  Has pain at base of penis and rectum after ejaculation.  Also burning with ejaculation.  Difficulty getting erection sometimes.  All of his symptoms seem to be Getting worse.  Also with hesitancy and dribbling at end of urination.  Reports some fullness and tenderness of the epididymis (has h/o epididymitis, which he was told was related to h/o vasectomy).  He massaged and soaked the area and had some relief of those symptoms.  He is only sexually active with his wife.  He has no history of STD.  He has no reason to believe that she has any other partners either.  He reports previous work-up by urology  Allergies  Allergen Reactions  . Phenergan [Promethazine Hcl] Other (See Comments)    Restless leg  . Adhesive [Tape] Rash    OK to use paper tape     Current Outpatient Medications:  .  BELBUCA 300 MCG FILM, SMARTSIG:1 Strip(s) By Mouth Every 12 Hours, Disp: , Rfl:  .  cetirizine (ZYRTEC) 10 MG tablet, Take 10 mg by mouth daily., Disp: , Rfl:  .  Citrulline POWD, Take by mouth daily., Disp: , Rfl:  .  gabapentin (NEURONTIN) 300 MG capsule, Take 300 mg 3 (three) times daily by mouth. , Disp: , Rfl:  .  guaiFENesin 200 MG tablet, Take 200 mg by mouth every 4 (four) hours as  needed for cough or to loosen phlegm., Disp: , Rfl:  .  hydrochlorothiazide (HYDRODIURIL) 25 MG tablet, TAKE 1 TABLET BY MOUTH EVERY DAY, Disp: 30 tablet, Rfl: 12 .  Multiple Vitamin (MULTIVITAMIN) tablet, Take 1 tablet by mouth daily., Disp: , Rfl:  .  ramipril (ALTACE) 10 MG capsule, TAKE 1 CAPSULE BY MOUTH EVERY DAY, Disp: 30 capsule, Rfl: 11 .  tiZANidine (ZANAFLEX) 4 MG tablet, Take 8 mg by mouth at bedtime as needed. , Disp: , Rfl:  .  doxycycline (VIBRA-TABS) 100 MG tablet, Take 1 tablet (100 mg total) by mouth 2 (two) times daily for 14 days., Disp: 28 tablet, Rfl: 0  Review of Systems  Constitutional: Negative.   Respiratory: Negative.   Cardiovascular: Negative.   Endocrine: Negative.   Genitourinary: Positive for difficulty urinating, dysuria, genital sores, scrotal swelling and testicular pain. Negative for decreased urine volume, discharge, enuresis, flank pain, frequency, hematuria, penile swelling and urgency.  Musculoskeletal: Negative.   Skin: Negative.   Neurological: Negative.   Psychiatric/Behavioral: Negative.     Social History   Tobacco Use  . Smoking status: Never Smoker  . Smokeless tobacco: Former Neurosurgeon    Types: Chew  . Tobacco comment: quit chewing over 20 years ago  Substance Use Topics  . Alcohol use: No    Alcohol/week: 0.0  standard drinks      Objective:   BP (!) 157/89   Pulse 75   Temp (!) 97.5 F (36.4 C) (Temporal)   Resp 16   Wt 173 lb (78.5 kg)   BMI 26.30 kg/m  Vitals:   11/25/19 1016 11/25/19 1018  BP: (!) 154/90 (!) 157/89  Pulse: 85 75  Resp: 16   Temp: (!) 97.5 F (36.4 C)   TempSrc: Temporal   Weight: 173 lb (78.5 kg)   Body mass index is 26.3 kg/m.   Physical Exam Vitals reviewed.  Constitutional:      General: He is not in acute distress.    Appearance: Normal appearance. He is not diaphoretic.  HENT:     Head: Normocephalic and atraumatic.  Eyes:     General: No scleral icterus.    Conjunctiva/sclera:  Conjunctivae normal.  Cardiovascular:     Rate and Rhythm: Normal rate and regular rhythm.     Heart sounds: Normal heart sounds.  Pulmonary:     Effort: Pulmonary effort is normal. No respiratory distress.     Breath sounds: Normal breath sounds. No wheezing or rales.  Abdominal:     General: There is no distension.     Palpations: Abdomen is soft.     Tenderness: There is no abdominal tenderness.  Genitourinary:    Penis: Normal.      Testes: Normal.     Epididymis:     Right: Not inflamed. Tenderness present.     Left: Not inflamed. Tenderness present.     Prostate: Tender (mildly). Not enlarged.     Rectum: External hemorrhoid present. Normal anal tone.  Musculoskeletal:     Right lower leg: No edema.     Left lower leg: No edema.  Skin:    General: Skin is warm and dry.     Findings: No rash.  Neurological:     Mental Status: He is alert and oriented to person, place, and time. Mental status is at baseline.  Psychiatric:        Mood and Affect: Mood normal.        Behavior: Behavior normal.      Results for orders placed or performed in visit on 11/25/19  POCT urinalysis dipstick  Result Value Ref Range   Color, UA yellow    Clarity, UA clear    Glucose, UA Negative Negative   Bilirubin, UA Negative    Ketones, UA Negative    Spec Grav, UA 1.015 1.010 - 1.025   Blood, UA Negative    pH, UA 6.5 5.0 - 8.0   Protein, UA Negative Negative   Urobilinogen, UA 0.2 0.2 or 1.0 E.U./dL   Nitrite, UA Negative    Leukocytes, UA Negative Negative       Assessment & Plan    1. Acute prostatitis - recurrent problem - UA negative, but does have symptoms and tenderness of prostate c/w prostatitis - subacute presentation, with no systemic symptoms -Denies any risk of STDs -Advised urology referral and evaluation, but patient states he is already been evaluated for this previously by urology -Start treatment with doxycycline x14 days -Discussed strict return  precautions  2. Epididymitis -Recurrent problem -Subacute presentation -Denies any risk of STDs -Advised urology referral and evaluation, but patient states he is already been evaluated for this previously by urology -As above, he will be treated with doxycycline x14 days -Discussed strict return precautions   Meds ordered this encounter  Medications  . doxycycline (VIBRA-TABS)  100 MG tablet    Sig: Take 1 tablet (100 mg total) by mouth 2 (two) times daily for 14 days.    Dispense:  28 tablet    Refill:  0     Return if symptoms worsen or fail to improve.   The entirety of the information documented in the History of Present Illness, Review of Systems and Physical Exam were personally obtained by me. Portions of this information were initially documented by Lynford Humphrey, CMA and reviewed by me for thoroughness and accuracy.    Margia Wiesen, Dionne Bucy, MD MPH [facility]

## 2019-11-25 NOTE — Telephone Encounter (Signed)
I returned his call.   He is c/o burning and frequency with urination which has gotten worse over the last month.  I made him an appt with Dr. Beryle Flock for today t 10:20.     See triage notes.  I sent my notes to the office.  Reason for Disposition . All other males with painful urination  Answer Assessment - Initial Assessment Questions 1. SEVERITY: "How bad is the pain?"  (e.g., Scale 1-10; mild, moderate, or severe)   - MILD (1-3): complains slightly about urination hurting   - MODERATE (4-7): interferes with normal activities     - SEVERE (8-10): excruciating, unwilling or unable to urinate because of the pain      I'm burning with urination and hesitation  for a month now.   I'm having rectal pain in the prostate area.   I've had problems with my prostate before.   After sex I have pain.   2019 I had this.   I went to a walk in clinic and was prescribed doxycycline and it cleared it up.      2. FREQUENCY: "How many times have you had painful urination today?"      Freqently going and it's not empying completed.   I'm having difficulty with erections. 3. PATTERN: "Is pain present every time you urinate or just sometimes?"      Yes it is burning. 4. ONSET: "When did the painful urination start?"      For a month.   It's gotten progressively worse 5. FEVER: "Do you have a fever?" If so, ask: "What is your temperature, how was it measured, and when did it start?"     No 6. PAST UTI: "Have you had a urine infection before?" If so, ask: "When was the last time?" and "What happened that time?"      Yes 7. CAUSE: "What do you think is causing the painful urination?"      Yes 8. OTHER SYMPTOMS: "Do you have any other symptoms?" (e.g., flank pain, penile discharge, scrotal pain, blood in urine)     Pain over the bladder area.   Problems with getting erections.  No blood.  Protocols used: URINATION PAIN - MALE-A-AH

## 2019-11-25 NOTE — Patient Instructions (Signed)
Prostatitis  Prostatitis is swelling of the prostate gland. The prostate helps to make semen. It is below a man's bladder, in front of the rectum. There are different types of prostatitis. Follow these instructions at home:   Take over-the-counter and prescription medicines only as told by your doctor.  If you were prescribed an antibiotic medicine, take it as told by your doctor. Do not stop taking the antibiotic even if you start to feel better.  If your doctor prescribed exercises, do them as directed.  Take sitz baths as told by your doctor. To take a sitz bath, sit in warm water that is deep enough to cover your hips and butt.  Keep all follow-up visits as told by your doctor. This is important. Contact a doctor if:  Your symptoms get worse.  You have a fever. Get help right away if:  You have chills.  You feel sick to your stomach (nauseous).  You throw up (vomit).  You feel light-headed.  You feel like you might pass out (faint).  You cannot pee (urinate).  You have blood or clumps of blood (blood clots) in your pee (urine). This information is not intended to replace advice given to you by your health care provider. Make sure you discuss any questions you have with your health care provider. Document Revised: 10/20/2017 Document Reviewed: 07/28/2016 Elsevier Patient Education  2020 [employer]  

## 2019-12-03 ENCOUNTER — Ambulatory Visit (INDEPENDENT_AMBULATORY_CARE_PROVIDER_SITE_OTHER): Payer: Medicare PPO | Admitting: Family Medicine

## 2019-12-03 ENCOUNTER — Encounter: Payer: Self-pay | Admitting: Family Medicine

## 2019-12-03 ENCOUNTER — Other Ambulatory Visit: Payer: Self-pay

## 2019-12-03 ENCOUNTER — Telehealth: Payer: Self-pay | Admitting: Family Medicine

## 2019-12-03 VITALS — BP 144/87 | HR 73 | Temp 97.1°F | Ht 68.0 in | Wt 170.8 lb

## 2019-12-03 DIAGNOSIS — N529 Male erectile dysfunction, unspecified: Secondary | ICD-10-CM

## 2019-12-03 DIAGNOSIS — Z0000 Encounter for general adult medical examination without abnormal findings: Secondary | ICD-10-CM

## 2019-12-03 DIAGNOSIS — I10 Essential (primary) hypertension: Secondary | ICD-10-CM | POA: Diagnosis not present

## 2019-12-03 DIAGNOSIS — Z125 Encounter for screening for malignant neoplasm of prostate: Secondary | ICD-10-CM

## 2019-12-03 DIAGNOSIS — N451 Epididymitis: Secondary | ICD-10-CM

## 2019-12-03 MED ORDER — SILDENAFIL CITRATE 50 MG PO TABS: 50.0000 mg | ORAL_TABLET | Freq: Every day | ORAL | 3 refills | Status: DC | PRN

## 2019-12-03 NOTE — Telephone Encounter (Signed)
Patient advised. He declines paying out of pocket for medication at this time.

## 2019-12-03 NOTE — Telephone Encounter (Signed)
[employer] only covers the 20mg  tablet for the treatment of Pulmonary Hypertension. The prior authorization will not be approved for anyone that does not have pulmonary hypertension. He will need to pay out of pocket. Is much less expensive at [facility] and [facility].

## 2019-12-03 NOTE — Progress Notes (Signed)
Patient: Danny Pham, Male    DOB: 05-30-1961, 59 y.o.   MRN: [id number] Visit Date: 12/03/2019  Today's Provider: Mila Merry, MD   Chief Complaint  Patient presents with  . Annual Exam  . Hypertension  . Hyperlipidemia  . Pre-diabetes   Subjective:     Patient had a AWE with McKenzie on 08/28/2019.    Annual physical exam Danny Pham is a 59 y.o. male who presents today for health maintenance and complete physical. He feels well. He reports exercising lightly due to back injury. He reports he is sleeping well.  -----------------------------------------------------------------  Hypertension, follow-up: BP Readings from Last 3 Encounters:  12/03/19 (!) 144/87  11/25/19 (!) 157/89  12/26/17 134/84   He was last seen for hypertension over 1 year ago.  BP at that visit was 134/84. Management since that visit includes no changes. He reports good compliance with treatment. He is not having side effects.  He is not exercising. He is  adherent to low salt diet.   Outside blood pressures are regularly checked. .  Weight trend: stable BP Readings from Last 3 Encounters:  12/03/19 (!) 144/87  11/25/19 (!) 157/89  12/26/17 134/84   Current diet: well balanced   Lipid/Cholesterol, Follow-up:   Last seen for this over 1 year ago.  Management changes since that visit include no changes.  Last Lipid Panel: Lab Results  Component Value Date   CHOL 159 12/26/2017   HDL 80 12/26/2017   LDLCALC 69 12/26/2017   TRIG 48 12/26/2017   CHOLHDL 2.0 12/26/2017    Risk factors for vascular disease include hypertension  He reports good compliance with treatment. He is not having side effects.  Current symptoms include none  Weight trend: stable Prior visit with dietician: no   Prediabetes, Follow-up:  Lab Results  Component Value Date   HGBA1C 6.1 (H) 12/26/2017   Last seen for for this over 1 year ago.  Management since that visit includes no  changes. Current symptoms include none and have been stable.  He was started on doxycycline last week by Dr. B for epididymitis and prostatitis which he states has improved, but not yet resolved.   He also states he he has been having more difficulty maintaining erections for intercourse and would like to try ED medication. Otherwise feels well.   Review of Systems  Constitutional: Negative.   HENT: Negative.   Eyes: Negative.   Respiratory: Negative.   Cardiovascular: Negative.   Gastrointestinal: Negative.   Endocrine: Negative.   Genitourinary: Negative.   Musculoskeletal: Negative.   Skin: Negative.   Allergic/Immunologic: Negative.   Neurological: Negative.   Hematological: Negative.   Psychiatric/Behavioral: Negative.     Social History      He  reports that he has never smoked. He has quit using smokeless tobacco.  His smokeless tobacco use included chew. He reports that he does not drink alcohol or use drugs.       Social History   Socioeconomic History  . Marital status: Married    Spouse name: Not on file  . Number of children: 1  . Years of education: Not on file  . Highest education level: 12th grade  Occupational History  . Occupation: Disabled    Comment: Due to back pain    Comment: retired  Tobacco Use  . Smoking status: Never Smoker  . Smokeless tobacco: Former Neurosurgeon    Types: Chew  . Tobacco comment: quit chewing  over 20 years ago  Substance and Sexual Activity  . Alcohol use: No    Alcohol/week: 0.0 standard drinks  . Drug use: No  . Sexual activity: Not on file  Other Topics Concern  . Not on file  Social History Narrative  . Not on file   Social Determinants of Health   Financial Resource Strain:   . Difficulty of Paying Living Expenses: Not on file  Food Insecurity:   . Worried About Programme researcher, broadcasting/film/video in the Last Year: Not on file  . Ran Out of Food in the Last Year: Not on file  Transportation Needs:   . Lack of Transportation  (Medical): Not on file  . Lack of Transportation (Non-Medical): Not on file  Physical Activity: Inactive  . Days of Exercise per Week: 0 days  . Minutes of Exercise per Session: 0 min  Stress: No Stress Concern Present  . Feeling of Stress : Only a little  Social Connections: Unknown  . Frequency of Communication with Friends and Family: Patient refused  . Frequency of Social Gatherings with Friends and Family: Patient refused  . Attends Religious Services: Patient refused  . Active Member of Clubs or Organizations: Patient refused  . Attends Banker Meetings: Patient refused  . Marital Status: Patient refused    Past Medical History:  Diagnosis Date  . Anxiety   . Back pain 2008  . Hypertension      Patient Active Problem List   Diagnosis Date Noted  . Recurrent inguinal hernia of right side without obstruction or gangrene 09/20/2017  . Allergic rhinitis, seasonal 09/21/2015  . Chronic back pain 09/21/2015  . Chronic anxiety 09/21/2015  . Hypertension 09/21/2015  . Prediabetes 09/21/2015  . Degeneration of intervertebral disc of lumbosacral region 04/27/2013  . Lumbosacral spondylosis 04/27/2013    Past Surgical History:  Procedure Laterality Date  . BACK SURGERY  2008   x2  . HERNIA REPAIR Right 1990   Dr Clydie Braun  . INGUINAL HERNIA REPAIR Right 10/03/2017   Medium Ultra Pro mesh. Surgeon: Earline Mayotte, MD;  Location: [facility] ORS;  Service: General;  Laterality: Right;  Marland Kitchen VASECTOMY  1986    Family History        Family Status  Relation Name Status  . Mother  Deceased at age 35       CHF  . Father  Deceased  . Other grandfather (Not Specified)  . Neg Hx  (Not Specified)        His family history includes CAD in an other family member; COPD in his mother; Congestive Heart Failure in his mother; Dementia in his father; Diabetes in his father and mother; Hypertension in his father and mother. There is no history of Colon cancer.      Allergies    Allergen Reactions  . Phenergan [Promethazine Hcl] Other (See Comments)    Restless leg  . Adhesive [Tape] Rash    OK to use paper tape     Current Outpatient Medications:  .  BELBUCA 300 MCG FILM, SMARTSIG:1 Strip(s) By Mouth Every 12 Hours, Disp: , Rfl:  .  cetirizine (ZYRTEC) 10 MG tablet, Take 10 mg by mouth daily., Disp: , Rfl:  .  Citrulline POWD, Take by mouth daily., Disp: , Rfl:  .  guaiFENesin 200 MG tablet, Take 200 mg by mouth every 4 (four) hours as needed for cough or to loosen phlegm., Disp: , Rfl:  .  hydrochlorothiazide (HYDRODIURIL) 25 MG tablet,  TAKE 1 TABLET BY MOUTH EVERY DAY, Disp: 30 tablet, Rfl: 12 .  Multiple Vitamin (MULTIVITAMIN) tablet, Take 1 tablet by mouth daily., Disp: , Rfl:  .  ramipril (ALTACE) 10 MG capsule, TAKE 1 CAPSULE BY MOUTH EVERY DAY, Disp: 30 capsule, Rfl: 11 .  tiZANidine (ZANAFLEX) 4 MG tablet, Take 8 mg by mouth at bedtime as needed. , Disp: , Rfl:  .  gabapentin (NEURONTIN) 300 MG capsule, Take 300 mg 3 (three) times daily by mouth. , Disp: , Rfl:    Patient Care Team: Birdie Sons, MD as PCP - General (Family Medicine) Marjean Donna, PA-C as Physician Assistant (Physician Assistant)    Objective:    Vitals: BP (!) 144/87 (BP Location: Right Arm, Patient Position: Sitting, Cuff Size: Normal)   Pulse 73   Temp (!) 97.1 F (36.2 C) (Temporal)   Ht 5\' 8"  (1.727 m)   Wt 170 lb 12.8 oz (77.5 kg)   BMI 25.97 kg/m    Vitals:   12/03/19 0841  BP: (!) 144/87  Pulse: 73  Temp: (!) 97.1 F (36.2 C)  TempSrc: Temporal  Weight: 170 lb 12.8 oz (77.5 kg)  Height: 5\' 8"  (1.727 m)     Physical Exam   General Appearance:    Well developed, well nourished male. Alert, cooperative, in no acute distress, appears stated age  Head:    Normocephalic, without obvious abnormality, atraumatic  Eyes:    PERRL, conjunctiva/corneas clear, EOM's intact, fundi    benign, both eyes       Ears:    Normal TM's and external ear canals, both ears   Nose:   Nares normal, septum midline, mucosa normal, no drainage   or sinus tenderness  Throat:   Lips, mucosa, and tongue normal; teeth and gums normal  Neck:   Supple, symmetrical, trachea midline, no adenopathy;       thyroid:  No enlargement/tenderness/nodules; no carotid   bruit or JVD  Back:     Symmetric, no curvature, ROM normal, no CVA tenderness  Lungs:     Clear to auscultation bilaterally, respirations unlabored  Chest wall:    No tenderness or deformity  Heart:    Normal heart rate. Normal rhythm. No murmurs, rubs, or gallops.  S1 and S2 normal  Abdomen:     Soft, non-tender, bowel sounds active all four quadrants,    no masses, no organomegaly  Genitalia:    deferred  Rectal:    deferred  Extremities:   All extremities are intact. No cyanosis or edema  Pulses:   2+ and symmetric all extremities  Skin:   Skin color, texture, turgor normal, no rashes or lesions  Lymph nodes:   Cervical, supraclavicular, and axillary nodes normal  Neurologic:   CNII-XII intact. Normal strength, sensation and reflexes      throughout    Depression Screen PHQ 2/9 Scores 08/28/2019 12/26/2017 10/18/2016 09/22/2015  PHQ - 2 Score 0 0 0 0  PHQ- 9 Score - - - 0  Exception Documentation - Patient refusal - -       Assessment & Plan:     Routine Health Maintenance and Physical Exam  Exercise Activities and Dietary recommendations Goals    . Weight (lb) < 185 lb (83.9 kg)     Recommend to continue current diet regimen and cutting out fats to aid in weight loss of 15 or more pounds.        Immunization History  Administered Date(s) Administered  .  Tdap 02/12/2013    Health Maintenance  Topic Date Due  . COLONOSCOPY  07/01/2011  . COLON CANCER SCREENING ANNUAL FOBT  12/27/2018  . INFLUENZA VACCINE  02/19/2020 (Originally 06/22/2019)  . HIV Screening  08/27/2020 (Originally 06/30/1976)  . TETANUS/TDAP  02/13/2023  . Hepatitis C Screening  Completed     Discussed health benefits  of physical activity, and encouraged him to engage in regular exercise appropriate for his age and condition.    --------------------------------------------------------------------  1. Annual physical exam   2. Prostate cancer screening  - PSA  3. Essential hypertension SBP above goal today, but usually better controlled. Continue current medications.  Continue to work on [employer].  - Comprehensive metabolic panel - Lipid panel (fasting)  4. Erectile dysfunction, unspecified erectile dysfunction type try- sildenafil (VIAGRA) 50 MG tablet; Take 1 tablet (50 mg total) by mouth daily as needed for erectile dysfunction.  Dispense: 20 tablet; Refill: 3  5. Epididymitis Improving, but not resolved. He is to call back if not completely resolved when finished with antibiotic.   The entirety of the information documented in the History of Present Illness, Review of Systems and Physical Exam were personally obtained by me. Portions of this information were initially documented by Kavin Leech, CMA and reviewed by me for thoroughness and accuracy.     Mila Merry, MD  [facility]

## 2019-12-03 NOTE — Telephone Encounter (Signed)
Pt states he insurance will only pay for sildenafil 20 mg   The dr sent in sildenafil 50 mg.  [employer] also advised this will need to request a prior authorization.  Pt would like you to send in a new Rx to  [id number] - [place], [place] - [address] AT [address] [facility] [facility] Phone:  [phone]  Fax:  [phone]

## 2019-12-04 LAB — COMPREHENSIVE METABOLIC PANEL
ALT: 19 IU/L (ref 0–44)
AST: 19 IU/L (ref 0–40)
Albumin/Globulin Ratio: 2.6 — ABNORMAL HIGH (ref 1.2–2.2)
Albumin: 4.9 g/dL (ref 3.8–4.9)
Alkaline Phosphatase: 83 IU/L (ref 39–117)
BUN/Creatinine Ratio: 27 — ABNORMAL HIGH (ref 9–20)
BUN: 26 mg/dL — ABNORMAL HIGH (ref 6–24)
Bilirubin Total: 0.4 mg/dL (ref 0.0–1.2)
CO2: 26 mmol/L (ref 20–29)
Calcium: 10.6 mg/dL — ABNORMAL HIGH (ref 8.7–10.2)
Chloride: 97 mmol/L (ref 96–106)
Creatinine, Ser: 0.97 mg/dL (ref 0.76–1.27)
GFR calc Af Amer: 99 mL/min/{1.73_m2} (ref >59)
GFR calc non Af Amer: 86 mL/min/{1.73_m2} (ref >59)
Globulin, Total: 1.9 g/dL (ref 1.5–4.5)
Glucose: 125 mg/dL — ABNORMAL HIGH (ref 65–99)
Potassium: 4.6 mmol/L (ref 3.5–5.2)
Sodium: 138 mmol/L (ref 134–144)
Total Protein: 6.8 g/dL (ref 6.0–8.5)

## 2019-12-04 LAB — LIPID PANEL
Chol/HDL Ratio: 2.2 ratio (ref 0.0–5.0)
Cholesterol, Total: 194 mg/dL (ref 100–199)
HDL: 87 mg/dL (ref >39)
LDL Chol Calc (NIH): 91 mg/dL (ref 0–99)
Triglycerides: 88 mg/dL (ref 0–149)
VLDL Cholesterol Cal: 16 mg/dL (ref 5–40)

## 2019-12-04 LAB — PSA: Prostate Specific Ag, Serum: 0.4 ng/mL (ref 0.0–4.0)

## 2019-12-06 ENCOUNTER — Telehealth: Payer: Self-pay | Admitting: Family Medicine

## 2019-12-06 MED ORDER — DOXYCYCLINE HYCLATE 100 MG PO TABS: 100.0000 mg | ORAL_TABLET | Freq: Two times a day (BID) | ORAL | 0 refills | Status: AC

## 2019-12-06 NOTE — Telephone Encounter (Signed)
Copied from CRM [id number]. Topic: General - Other >> Dec 06, 2019  9:07 AM Tamela Oddi wrote: Reason for CRM: Patient called to inform the doctor that he wanted the medication for the prostate infection, doxycycline.  He stated that the doctor told him to call if he wanted the medication.  Please advise and call to discuss at [phone]

## 2019-12-06 NOTE — Telephone Encounter (Signed)
Patient reports persistent pain in prostate area. Patient reports pain at base of penis and rectum after ejaculation.  Also burning with ejaculation.  Difficulty getting erection sometimes.  All of his symptoms seem to be getting better but not resolved.  Also with hesitancy and dribbling at end of urination. Patient denies any fever, painful urination or abnormal discharge.[facility]

## 2020-08-31 ENCOUNTER — Telehealth: Payer: Self-pay | Admitting: Family Medicine

## 2020-08-31 NOTE — Telephone Encounter (Signed)
Copied from CRM [id number]. Topic: Medicare AWV >> Aug 31, 2020  2:59 PM Claudette Laws R wrote: Reason for CRM:  Left message for patient to call back and schedule Medicare Annual Wellness Visit (AWV) either virtually or in office.  Last AWV 08/28/2019  Please schedule at anytime with [facility] Advisor.  If any questions, please contact me at [phone]

## 2021-05-03 ENCOUNTER — Other Ambulatory Visit: Payer: Self-pay | Admitting: Family Medicine

## 2021-05-03 NOTE — Telephone Encounter (Signed)
Requested medication (s) are due for refill today: no  Requested medication (s) are on the active medication list: yes   Last refill:  06/15/2020  Future visit scheduled: yes   Notes to clinic: script has expired  Review for continued use and refill    Requested Prescriptions  Pending Prescriptions Disp Refills   ramipril (ALTACE) 10 MG capsule [Pharmacy Med Name: RAMIPRIL 10 MG CAPSULE] 30 capsule 11    Sig: TAKE 1 CAPSULE BY MOUTH EVERY DAY      Cardiovascular:  ACE Inhibitors Failed - 05/03/2021  9:24 AM      Failed - Cr in normal range and within 180 days    Creatinine, Ser  Date Value Ref Range Status  12/03/2019 0.97 0.76 - 1.27 mg/dL Final          Failed - K in normal range and within 180 days    Potassium  Date Value Ref Range Status  12/03/2019 4.6 3.5 - 5.2 mmol/L Final          Failed - Last BP in normal range    BP Readings from Last 1 Encounters:  12/03/19 (!) 144/87          Failed - Valid encounter within last 6 months    Recent Outpatient Visits           1 year ago Annual physical exam   [facility] Malva Limes, MD   1 year ago Acute prostatitis   [facility], Marzella Schlein, MD   3 years ago Annual physical exam   [facility] Malva Limes, MD   3 years ago Right inguinal hernia   [facility] Malva Limes, MD   4 years ago Annual physical exam   [facility] Malva Limes, MD       Future Appointments             In 1 month Fisher, Demetrios Isaacs, MD [facility], [facility]             Passed - Patient is not pregnant

## 2021-06-04 ENCOUNTER — Ambulatory Visit: Payer: Medicare PPO | Admitting: Family Medicine

## 2021-06-04 ENCOUNTER — Other Ambulatory Visit: Payer: Self-pay

## 2021-06-04 ENCOUNTER — Encounter: Payer: Self-pay | Admitting: Family Medicine

## 2021-06-04 VITALS — BP 144/92 | HR 78 | Temp 97.7°F | Resp 16 | Wt 158.6 lb

## 2021-06-04 DIAGNOSIS — K4090 Unilateral inguinal hernia, without obstruction or gangrene, not specified as recurrent: Secondary | ICD-10-CM | POA: Diagnosis not present

## 2021-06-04 DIAGNOSIS — G2581 Restless legs syndrome: Secondary | ICD-10-CM

## 2021-06-04 DIAGNOSIS — I10 Essential (primary) hypertension: Secondary | ICD-10-CM | POA: Diagnosis not present

## 2021-06-04 NOTE — Progress Notes (Signed)
Established patient visit   Patient: Danny Pham   DOB: Sep 17, 1961   60 y.o. Male  MRN: [id number] Visit Date: 06/04/2021  Today's healthcare provider: Mila Merry, MD   Chief Complaint  Patient presents with   Groin Pain    Subjective    HPI  Inguinal pain: Patient complains of pain and swelling of the left inguinal. Symptoms started 2 months ago and has worsened.   Hypertension, follow-up  BP Readings from Last 3 Encounters:  06/04/21 (!) 180/92  12/03/19 (!) 144/87  11/25/19 (!) 157/89   Wt Readings from Last 3 Encounters:  06/04/21 158 lb 9.6 oz (71.9 kg)  12/03/19 170 lb 12.8 oz (77.5 kg)  11/25/19 173 lb (78.5 kg)     He was last seen for hypertension on 12/03/2019.   BP at that visit was 144/87. Management since that visit includes advising patient to work on healthy diet.  He reports poor compliance with treatment (patient stopped taking his blood pressure medications after loosing weight. He has lost 12 pounds since last year.  He is not having side effects.  He is following a  low carb  diet. He is not exercising. He does not smoke.  Use of agents associated with hypertension: none.   Outside blood pressures are checked (patient states the results are usually normal). Symptoms: No chest pain No chest pressure  No palpitations No syncope  No dyspnea No orthopnea  No paroxysmal nocturnal dyspnea No lower extremity edema   Pertinent labs: Lab Results  Component Value Date   CHOL 194 12/03/2019   HDL 87 12/03/2019   LDLCALC 91 12/03/2019   TRIG 88 12/03/2019   CHOLHDL 2.2 12/03/2019   Lab Results  Component Value Date   NA 138 12/03/2019   K 4.6 12/03/2019   CREATININE 0.97 12/03/2019   GFRNONAA 86 12/03/2019   GFRAA 99 12/03/2019   GLUCOSE 125 (H) 12/03/2019     The 10-year ASCVD risk score Denman George DC Jr., et al., 2013) is: 10.7%   ---------------------------------------------------------------------------------------------------       Medications: Outpatient Medications Prior to Visit  Medication Sig   BELBUCA 300 MCG FILM SMARTSIG:1 Strip(s) By Mouth Every 12 Hours   Citrulline POWD Take by mouth daily.   gabapentin (NEURONTIN) 300 MG capsule Take 300 mg 3 (three) times daily by mouth.    guaiFENesin 200 MG tablet Take 200 mg by mouth every 4 (four) hours as needed for cough or to loosen phlegm.   Multiple Vitamin (MULTIVITAMIN) tablet Take 1 tablet by mouth daily.   tiZANidine (ZANAFLEX) 4 MG tablet Take 8 mg by mouth at bedtime as needed.    hydrochlorothiazide (HYDRODIURIL) 25 MG tablet TAKE 1 TABLET BY MOUTH EVERY DAY (Patient not taking: Reported on 06/04/2021)   ramipril (ALTACE) 10 MG capsule TAKE 1 CAPSULE BY MOUTH EVERY DAY (Patient not taking: Reported on 06/04/2021)   sildenafil (VIAGRA) 50 MG tablet Take 1 tablet (50 mg total) by mouth daily as needed for erectile dysfunction. (Patient not taking: Reported on 06/04/2021)   [DISCONTINUED] cetirizine (ZYRTEC) 10 MG tablet Take 10 mg by mouth daily. (Patient not taking: Reported on 06/04/2021)   No facility-administered medications prior to visit.    Review of Systems  Constitutional:  Negative for appetite change, chills and fever.  Respiratory:  Negative for chest tightness, shortness of breath and wheezing.   Cardiovascular:  Negative for chest pain and palpitations.  Gastrointestinal:  Negative for abdominal pain, nausea and  vomiting.  Genitourinary:        Left inguinal pain      Objective    BP (!) 180/92 (BP Location: Right Arm, Patient Position: Sitting, Cuff Size: Normal)   Pulse 78   Temp 97.7 F (36.5 C) (Temporal)   Resp 16   Wt 158 lb 9.6 oz (71.9 kg)   BMI 24.12 kg/m   Today's Vitals   06/04/21 1541 06/04/21 1546  BP: (!) 161/86 (!) 180/92  Pulse: 78   Resp: 16   Temp: 97.7 F (36.5 C)   TempSrc: Temporal   Weight: 158 lb 9.6 oz (71.9 kg)    Body mass index is 24.12 kg/m.     Physical Exam   General: Appearance:     Well developed, well nourished male in no acute distress  Eyes:    PERRL, conjunctiva/corneas clear, EOM's intact       Lungs:     Clear to auscultation bilaterally, respirations unlabored  Heart:    Normal heart rate. Normal rhythm. No murmurs, rubs, or gallops.    Abd:   Medium reducible left upper inguinal hernia       Assessment & Plan     1. Left inguinal hernia  - Ambulatory referral to General Surgery  2. Primary hypertension Home BP have been dramatically better since losing weight and has stopped ramipril and hctz. He is going to continue monitor home BP and let me now if they run over 140.   3. Restless leg syndrome Is currently taking gabapentin prn prescribed through worker's comp       The entirety of the information documented in the History of Present Illness, Review of Systems and Physical Exam were personally obtained by me. Portions of this information were initially documented by the CMA and reviewed by me for thoroughness and accuracy.     Mila Merry, MD  [facility] [phone] (phone) [phone] (fax)  [facility]

## 2021-06-24 ENCOUNTER — Other Ambulatory Visit: Payer: Self-pay | Admitting: General Surgery

## 2021-06-24 DIAGNOSIS — K4090 Unilateral inguinal hernia, without obstruction or gangrene, not specified as recurrent: Secondary | ICD-10-CM | POA: Diagnosis not present

## 2021-06-24 NOTE — Progress Notes (Signed)
Subjective:     Patient ID: Danny Pham is a 60 y.o. male.   HPI   The following portions of the patient's history were reviewed and updated as appropriate.   This an established patient is here today for: office visit. He is here for evaluation of a left inguinal hernia referred by Dr Sherrie Mustache. He states he has a knot that comes and goes, he can push it back in but it comes back out. If he states for a long period of time it becomes uncomfortable. He states it hurts when he coughs. Weight loss of 50 pounds in 2 years, Paleo diet.   Bowels move daily, no bleeding.   Review of Systems  Constitutional: Negative for chills and fever.  Respiratory: Negative for cough.   Gastrointestinal: Negative for anal bleeding.         Chief Complaint  Patient presents with   Hernia      BP (!) 152/90   Pulse 86   Temp 36.9 C (98.5 F)   Ht 172.7 cm (5\' 8" )   Wt 68.5 kg (151 lb)   SpO2 97%   BMI 22.96 kg/m        Past Medical History:  Diagnosis Date   Anxiety     Hypertension             Past Surgical History:  Procedure Laterality Date   back surgery   2008    x 2   history of hernia repair   1990    Dr. 1991 HERNIA REPAIR Right 10/03/2017    Dr. 10/05/2017   VASECTOMY   1986          Social History           Socioeconomic History   Marital status: Unknown  Tobacco Use   Smoking status: Never Smoker   Smokeless tobacco: Former Lemar Livings      Types: Chew             Allergies  Allergen Reactions   Phenergan [Promethazine] Other (See Comments)      Restless leg   Adhesive Tape-Silicones Rash      Ok to use paper tape      Current Medications        Current Outpatient Medications  Medication Sig Dispense Refill   buprenorphine (BELBUCA) 300 mcg buccal film Place inside cheek every 12 (twelve) hours       gabapentin (NEURONTIN) 300 MG capsule Take 300 mg by mouth as needed       hydroCHLOROthiazide (HYDRODIURIL) 25 MG tablet Take 25 mg by mouth  as needed       ramipriL (ALTACE) 10 MG capsule Take 10 mg by mouth once daily       tiZANidine (ZANAFLEX) 4 MG tablet Take 8 mg by mouth at bedtime        No current facility-administered medications for this visit.             Family History  Problem Relation Age of Onset   High blood pressure (Hypertension) Mother     Diabetes Mother     COPD Mother     Heart failure Mother     High blood pressure (Hypertension) Father     Diabetes Father     Dementia Father     Colon cancer Neg Hx             Objective:   Physical Exam Constitutional:  Appearance: Normal appearance.  Cardiovascular:     Rate and Rhythm: Normal rate and regular rhythm.     Pulses: Normal pulses.     Heart sounds: Normal heart sounds.  Pulmonary:     Effort: Pulmonary effort is normal.     Breath sounds: Normal breath sounds.  Abdominal:     Hernia: A hernia is present. Hernia is present in the left inguinal area. There is no hernia in the right inguinal area.  Genitourinary:      Comments: Well-healed right inguinal hernia scar.  Visible bulge in the area of the left external ring with reducible contents.  Consistent with a hernia. Musculoskeletal:     Cervical back: Neck supple.  Skin:    General: Skin is warm and dry.  Neurological:     Mental Status: He is alert and oriented to person, place, and time.  Psychiatric:        Behavior: Behavior normal.           Assessment:     Increasingly symptomatic left inguinal hernia.    Plan:     The patient has been aware of some discomfort in this area for a number years, but it significantly increased in the last few months.  With his weight loss on his paleo diet, it is also become more visible.  At the time of his September 20, 2017 exam no defect was palpable.   Indications for elective repair were reviewed.   The patient and his wife have a 62 year old mentally handicapped daughter.  I did speak with the head nurse of day surgery and  the daughter will be allowed to come into the building and stay with her mother while her father has surgery.      This note is partially prepared by Dorathy Daft, RN, acting as a scribe in the presence of Dr. Donnalee Curry, MD.  The documentation recorded by the scribe accurately reflects the service I personally performed and the decisions made by me.    Earline Mayotte, MD FACS

## 2021-06-29 ENCOUNTER — Other Ambulatory Visit: Payer: Self-pay

## 2021-06-29 ENCOUNTER — Other Ambulatory Visit
Admission: RE | Admit: 2021-06-29 | Discharge: 2021-06-29 | Disposition: A | Discharge status: Home / Self Care | Payer: Medicare PPO | Source: Ambulatory Visit | Attending: General Surgery | Admitting: General Surgery

## 2021-06-29 NOTE — Patient Instructions (Signed)
Your procedure is scheduled on: Friday July 02, 2021. Report to [facility] 2nd floor stop by admissions desk first before getting on elevator. To find out your arrival time please call [phone] between 1PM - 3PM on Thursday July 01, 2021.  Remember: Instructions that are not followed completely may result in serious medical risk,  up to and including death, or upon the discretion of your surgeon and anesthesiologist your  surgery may need to be rescheduled.     _X__ 1. Do not eat food after midnight the night before your procedure.                 No chewing gum or hard candies. You may drink clear liquids up to 2 hours                 before you are scheduled to arrive for your surgery- DO not drink clear                 liquids within 2 hours of the start of your surgery.                 Clear Liquids include:  water, apple juice without pulp, clear Gatorade, G2 or                  Gatorade Zero (avoid Red/Purple/Blue), Black Coffee or Tea (Do not add                 anything to coffee or tea).  __X__2.  On the morning of surgery brush your teeth with toothpaste and water, you                may rinse your mouth with mouthwash if you wish.  Do not swallow any toothpaste of mouthwash.     _X__ 3.  No Alcohol for 24 hours before or after surgery.   _X__ 4.  Do Not Smoke or use e-cigarettes For 24 Hours Prior to Your Surgery.                 Do not use any chewable tobacco products for at least 6 hours prior to                 Surgery.  _X__  5.  Do not use any recreational drugs (marijuana, cocaine, heroin, ecstasy, MDMA or other)                For at least one week prior to your surgery.  Combination of these drugs with anesthesia                May have life threatening results.  __X__6.  Notify your doctor if there is any change in your medical condition      (cold, fever, infections).     Do not wear jewelry, make-up,  hairpins, clips or nail polish. Do not wear lotions, powders, or perfumes. You may wear deodorant. Do not shave 48 hours prior to surgery. Men may shave face and neck. Do not bring valuables to the hospital.    [facility] is not responsible for any belongings or valuables.  Contacts, dentures or bridgework may not be worn into surgery. Leave your suitcase in the car. After surgery it may be brought to your room. For patients admitted to the hospital, discharge time is determined by your treatment team.   Patients discharged the day of surgery will not be allowed to drive home.  Make arrangements for someone to be with you for the first 24 hours of your Same Day Discharge.   __X__ Take these medicines the morning of surgery with A SIP OF WATER:    1. BELBUCA 300 MCG (if needed)  2.   3.   4.  5.  6.  ____ Fleet Enema (as directed)   __X__ Use CHG Soap (or wipes) as directed  ____ Use Benzoyl Peroxide Gel as instructed  ____ Use inhalers on the day of surgery  ____ Stop metformin 2 days prior to surgery    ____ Take 1/2 of usual insulin dose the night before surgery. No insulin the morning          of surgery.   ____ Call your PCP, cardiologist, or Pulmonologist if taking Coumadin/Plavix/aspirin and ask when to stop before your surgery.   __X__ One Week prior to surgery- Stop Anti-inflammatories such as Ibuprofen, Aleve, Advil, Motrin, meloxicam (MOBIC), diclofenac, etodolac, ketorolac, Toradol, Daypro, piroxicam, Goody's or BC powders. OK TO USE TYLENOL IF NEEDED   __X__ Do not start supplements until after surgery.    ____ Bring C-Pap to the hospital.    If you have any questions regarding your pre-procedure instructions,  Please call Pre-admit Testing at [phone]

## 2021-06-30 ENCOUNTER — Encounter
Admission: RE | Admit: 2021-06-30 | Discharge: 2021-06-30 | Disposition: A | Discharge status: Home / Self Care | Payer: Medicare PPO | Source: Ambulatory Visit | Attending: General Surgery | Admitting: General Surgery

## 2021-06-30 DIAGNOSIS — Z01818 Encounter for other preprocedural examination: Secondary | ICD-10-CM | POA: Diagnosis not present

## 2021-06-30 DIAGNOSIS — I10 Essential (primary) hypertension: Secondary | ICD-10-CM | POA: Diagnosis not present

## 2021-06-30 LAB — BASIC METABOLIC PANEL
Anion gap: 12 (ref 5–15)
BUN: 31 mg/dL — ABNORMAL HIGH (ref 6–20)
CO2: 26 mmol/L (ref 22–32)
Calcium: 9.8 mg/dL (ref 8.9–10.3)
Chloride: 99 mmol/L (ref 98–111)
Creatinine, Ser: 0.96 mg/dL (ref 0.61–1.24)
GFR, Estimated: >60 mL/min (ref >60)
Glucose, Bld: 112 mg/dL — ABNORMAL HIGH (ref 70–99)
Potassium: 3.8 mmol/L (ref 3.5–5.1)
Sodium: 137 mmol/L (ref 135–145)

## 2021-06-30 LAB — CBC
HCT: 43.7 % (ref 39.0–52.0)
Hemoglobin: 15 g/dL (ref 13.0–17.0)
MCH: 30.3 pg (ref 26.0–34.0)
MCHC: 34.3 g/dL (ref 30.0–36.0)
MCV: 88.3 fL (ref 80.0–100.0)
Platelets: 225 10*3/uL (ref 150–400)
RBC: 4.95 MIL/uL (ref 4.22–5.81)
RDW: 13.3 % (ref 11.5–15.5)
WBC: 4.7 10*3/uL (ref 4.0–10.5)
nRBC: 0 % (ref 0.0–0.2)

## 2021-07-02 ENCOUNTER — Ambulatory Visit: Payer: Medicare PPO | Admitting: Anesthesiology

## 2021-07-02 ENCOUNTER — Ambulatory Visit
Admission: RE | Admit: 2021-07-02 | Discharge: 2021-07-02 | Disposition: A | Discharge status: Home / Self Care | Payer: Medicare PPO | Attending: General Surgery | Admitting: General Surgery

## 2021-07-02 ENCOUNTER — Encounter: Payer: Self-pay | Admitting: General Surgery

## 2021-07-02 ENCOUNTER — Other Ambulatory Visit: Payer: Self-pay

## 2021-07-02 ENCOUNTER — Ambulatory Visit: Payer: Medicare PPO | Admitting: Urgent Care

## 2021-07-02 ENCOUNTER — Encounter
Admission: RE | Disposition: A | Discharge status: Home / Self Care | Payer: Self-pay | Source: Home / Self Care | Attending: General Surgery

## 2021-07-02 DIAGNOSIS — K4090 Unilateral inguinal hernia, without obstruction or gangrene, not specified as recurrent: Secondary | ICD-10-CM | POA: Diagnosis not present

## 2021-07-02 DIAGNOSIS — Z888 Allergy status to other drugs, medicaments and biological substances status: Secondary | ICD-10-CM | POA: Insufficient documentation

## 2021-07-02 DIAGNOSIS — Z79899 Other long term (current) drug therapy: Secondary | ICD-10-CM | POA: Insufficient documentation

## 2021-07-02 HISTORY — PX: INGUINAL HERNIA REPAIR: SHX194

## 2021-07-02 SURGERY — REPAIR, HERNIA, INGUINAL, ADULT
Anesthesia: General | Laterality: Left

## 2021-07-02 MED ORDER — OXYCODONE HCL 5 MG/5ML PO SOLN: 5.0000 mg | Freq: Once | ORAL | Status: DC | PRN

## 2021-07-02 MED ORDER — ACETAMINOPHEN 10 MG/ML IV SOLN
INTRAVENOUS | Status: DC | PRN
Start: 2021-07-02 — End: 2021-07-02
  Administered 2021-07-02: 1000 mg via INTRAVENOUS

## 2021-07-02 MED ORDER — EPHEDRINE 5 MG/ML INJ
INTRAVENOUS | Status: AC
  Filled 2021-07-02: qty 5

## 2021-07-02 MED ORDER — ACETAMINOPHEN 10 MG/ML IV SOLN
INTRAVENOUS | Status: AC
  Filled 2021-07-02: qty 100

## 2021-07-02 MED ORDER — BUPIVACAINE-EPINEPHRINE (PF) 0.5% -1:200000 IJ SOLN
INTRAMUSCULAR | Status: AC
  Filled 2021-07-02: qty 30

## 2021-07-02 MED ORDER — CEFAZOLIN SODIUM-DEXTROSE 2-4 GM/100ML-% IV SOLN
INTRAVENOUS | Status: AC
  Filled 2021-07-02: qty 100

## 2021-07-02 MED ORDER — CHLORHEXIDINE GLUCONATE CLOTH 2 % EX PADS
6.0000 | MEDICATED_PAD | Freq: Once | CUTANEOUS | Status: AC
Start: 2021-07-02 — End: 2021-07-02
  Administered 2021-07-02: 6 via TOPICAL

## 2021-07-02 MED ORDER — MIDAZOLAM HCL 2 MG/2ML IJ SOLN
INTRAMUSCULAR | Status: DC | PRN
  Administered 2021-07-02 (×2): 1 mg via INTRAVENOUS

## 2021-07-02 MED ORDER — ORAL CARE MOUTH RINSE: 15.0000 mL | Freq: Once | OROMUCOSAL | Status: AC

## 2021-07-02 MED ORDER — CEFAZOLIN SODIUM-DEXTROSE 2-4 GM/100ML-% IV SOLN
2.0000 g | INTRAVENOUS | Status: AC
  Administered 2021-07-02: 2 g via INTRAVENOUS

## 2021-07-02 MED ORDER — FENTANYL CITRATE (PF) 100 MCG/2ML IJ SOLN
INTRAMUSCULAR | Status: AC
  Filled 2021-07-02: qty 2

## 2021-07-02 MED ORDER — CHLORHEXIDINE GLUCONATE CLOTH 2 % EX PADS: 6.0000 | MEDICATED_PAD | Freq: Once | CUTANEOUS | Status: DC

## 2021-07-02 MED ORDER — FAMOTIDINE 20 MG PO TABS
ORAL_TABLET | ORAL | Status: AC
  Administered 2021-07-02: 20 mg via ORAL
  Filled 2021-07-02: qty 1

## 2021-07-02 MED ORDER — LACTATED RINGERS IV SOLN
INTRAVENOUS | Status: DC
Start: 2021-07-02 — End: 2021-07-02

## 2021-07-02 MED ORDER — FAMOTIDINE 20 MG PO TABS: 20.0000 mg | ORAL_TABLET | Freq: Once | ORAL | Status: AC

## 2021-07-02 MED ORDER — ONDANSETRON HCL 4 MG/2ML IJ SOLN
INTRAMUSCULAR | Status: DC | PRN
  Administered 2021-07-02: 4 mg via INTRAVENOUS

## 2021-07-02 MED ORDER — MIDAZOLAM HCL 2 MG/2ML IJ SOLN
INTRAMUSCULAR | Status: AC
  Filled 2021-07-02: qty 2

## 2021-07-02 MED ORDER — LIDOCAINE HCL (CARDIAC) PF 100 MG/5ML IV SOSY
PREFILLED_SYRINGE | INTRAVENOUS | Status: DC | PRN
  Administered 2021-07-02: 50 mg via INTRAVENOUS

## 2021-07-02 MED ORDER — CHLORHEXIDINE GLUCONATE 0.12 % MT SOLN: 15.0000 mL | Freq: Once | OROMUCOSAL | Status: AC

## 2021-07-02 MED ORDER — PROPOFOL 10 MG/ML IV BOLUS
INTRAVENOUS | Status: DC | PRN
  Administered 2021-07-02: 120 mg via INTRAVENOUS

## 2021-07-02 MED ORDER — FENTANYL CITRATE (PF) 100 MCG/2ML IJ SOLN: 25.0000 ug | INTRAMUSCULAR | Status: DC | PRN

## 2021-07-02 MED ORDER — FENTANYL CITRATE (PF) 100 MCG/2ML IJ SOLN
INTRAMUSCULAR | Status: DC | PRN
  Administered 2021-07-02: 25 ug via INTRAVENOUS

## 2021-07-02 MED ORDER — CHLORHEXIDINE GLUCONATE 0.12 % MT SOLN
OROMUCOSAL | Status: AC
  Administered 2021-07-02: 15 mL via OROMUCOSAL
  Filled 2021-07-02: qty 15

## 2021-07-02 MED ORDER — OXYCODONE HCL 5 MG PO TABS: 5.0000 mg | ORAL_TABLET | Freq: Once | ORAL | Status: DC | PRN

## 2021-07-02 MED ORDER — HYDROCODONE-ACETAMINOPHEN 5-325 MG PO TABS: 1.0000 | ORAL_TABLET | ORAL | 0 refills | Status: AC | PRN

## 2021-07-02 MED ORDER — ONDANSETRON HCL 4 MG/2ML IJ SOLN: 4.0000 mg | Freq: Once | INTRAMUSCULAR | Status: DC | PRN

## 2021-07-02 MED ORDER — EPHEDRINE SULFATE 50 MG/ML IJ SOLN
INTRAMUSCULAR | Status: DC | PRN
  Administered 2021-07-02: 10 mg via INTRAVENOUS

## 2021-07-02 MED ORDER — DEXAMETHASONE SODIUM PHOSPHATE 10 MG/ML IJ SOLN
INTRAMUSCULAR | Status: DC | PRN
  Administered 2021-07-02: 6 mg via INTRAVENOUS

## 2021-07-02 MED ORDER — KETOROLAC TROMETHAMINE 30 MG/ML IJ SOLN
INTRAMUSCULAR | Status: AC
  Filled 2021-07-02: qty 1

## 2021-07-02 MED ORDER — BUPIVACAINE-EPINEPHRINE (PF) 0.5% -1:200000 IJ SOLN
INTRAMUSCULAR | Status: DC | PRN
  Administered 2021-07-02: 30 mL

## 2021-07-02 SURGICAL SUPPLY — 36 items
APL PRP STRL LF DISP 70% ISPRP (MISCELLANEOUS) ×1
BLADE SURG 15 STRL SS SAFETY (BLADE) ×4 IMPLANT
CANISTER SUCT 1200ML W/VALVE (MISCELLANEOUS) ×2 IMPLANT
CHLORAPREP W/TINT 26 (MISCELLANEOUS) ×2 IMPLANT
DECANTER SPIKE VIAL GLASS SM (MISCELLANEOUS) ×2 IMPLANT
DRAIN PENROSE 12X.25 LTX STRL (MISCELLANEOUS) ×2 IMPLANT
DRAPE LAPAROTOMY 100X77 ABD (DRAPES) ×2 IMPLANT
DRSG TEGADERM 4X4.75 (GAUZE/BANDAGES/DRESSINGS) ×2 IMPLANT
DRSG TELFA 4X3 1S NADH ST (GAUZE/BANDAGES/DRESSINGS) ×2 IMPLANT
ELECT REM PT RETURN 9FT ADLT (ELECTROSURGICAL) ×2
ELECTRODE REM PT RTRN 9FT ADLT (ELECTROSURGICAL) ×1 IMPLANT
GAUZE 4X4 16PLY ~~LOC~~+RFID DBL (SPONGE) ×2 IMPLANT
GLOVE SURG ENC MOIS LTX SZ7.5 (GLOVE) ×2 IMPLANT
GLOVE SURG UNDER LTX SZ8 (GLOVE) ×2 IMPLANT
GOWN STRL REUS W/ TWL LRG LVL3 (GOWN DISPOSABLE) ×2 IMPLANT
GOWN STRL REUS W/TWL LRG LVL3 (GOWN DISPOSABLE) ×4
KIT TURNOVER KIT A (KITS) ×2 IMPLANT
LABEL OR SOLS (LABEL) ×2 IMPLANT
MANIFOLD NEPTUNE II (INSTRUMENTS) ×2 IMPLANT
MESH HERNIA 6X12 ULTRAPRO MED (Mesh General) ×1 IMPLANT
MESH HERNIA ULTRAPRO MED (Mesh General) ×1 IMPLANT
NEEDLE HYPO 22GX1.5 SAFETY (NEEDLE) ×4 IMPLANT
PACK BASIN MINOR ARMC (MISCELLANEOUS) ×2 IMPLANT
STRIP CLOSURE SKIN 1/2X4 (GAUZE/BANDAGES/DRESSINGS) ×2 IMPLANT
SUT PDS AB 0 CT1 27 (SUTURE) ×1 IMPLANT
SUT SURGILON 0 BLK (SUTURE) ×3 IMPLANT
SUT VIC AB 2-0 SH 27 (SUTURE) ×2
SUT VIC AB 2-0 SH 27XBRD (SUTURE) ×1 IMPLANT
SUT VIC AB 3-0 54X BRD REEL (SUTURE) ×1 IMPLANT
SUT VIC AB 3-0 BRD 54 (SUTURE) ×2
SUT VIC AB 3-0 SH 27 (SUTURE) ×2
SUT VIC AB 3-0 SH 27X BRD (SUTURE) ×1 IMPLANT
SUT VIC AB 4-0 FS2 27 (SUTURE) ×2 IMPLANT
SWABSTK COMLB BENZOIN TINCTURE (MISCELLANEOUS) ×2 IMPLANT
SYR 10ML LL (SYRINGE) ×2 IMPLANT
SYR 3ML LL SCALE MARK (SYRINGE) ×2 IMPLANT

## 2021-07-02 NOTE — Transfer of Care (Signed)
Immediate Anesthesia Transfer of Care Note  Patient: Danny Pham  Procedure(s) Performed: HERNIA REPAIR INGUINAL ADULT WITH PROSTHETIC MESH (Left)  Patient Location: PACU  Anesthesia Type:General  Level of Consciousness: awake, alert  and oriented  Airway & Oxygen Therapy: Patient Spontanous Breathing and Patient connected to face mask oxygen  Post-op Assessment: Report given to RN  Post vital signs: Reviewed and stable  Last Vitals:  Vitals Value Taken Time  BP 132/75 07/02/21 1242  Temp 37.2 C 07/02/21 1242  Pulse 71 07/02/21 1246  Resp 15 07/02/21 1246  SpO2 100 % 07/02/21 1246    Last Pain:  Vitals:   07/02/21 1007  TempSrc: Temporal  PainSc: 0-No pain         Complications: No notable events documented.

## 2021-07-02 NOTE — Anesthesia Procedure Notes (Signed)
Procedure Name: LMA Insertion Date/Time: 07/02/2021 11:54 AM Performed by: Ginger Carne, CRNA Pre-anesthesia Checklist: Patient identified, Emergency Drugs available, Suction available, Patient being monitored and Timeout performed Patient Re-evaluated:Patient Re-evaluated prior to induction Oxygen Delivery Method: Circle system utilized Preoxygenation: Pre-oxygenation with 100% oxygen Induction Type: IV induction LMA: LMA inserted LMA Size: 4.0 Tube type: Oral Number of attempts: 1 Placement Confirmation: positive ETCO2 and breath sounds checked- equal and bilateral Tube secured with: Tape Dental Injury: Teeth and Oropharynx as per pre-operative assessment

## 2021-07-02 NOTE — Anesthesia Postprocedure Evaluation (Signed)
Anesthesia Post Note  Patient: Danny Pham  Procedure(s) Performed: HERNIA REPAIR INGUINAL ADULT WITH PROSTHETIC MESH (Left)  Patient location during evaluation: PACU Anesthesia Type: General Level of consciousness: awake and alert and oriented Pain management: pain level controlled Vital Signs Assessment: post-procedure vital signs reviewed and stable Respiratory status: spontaneous breathing, nonlabored ventilation and respiratory function stable Cardiovascular status: blood pressure returned to baseline and stable Postop Assessment: no signs of nausea or vomiting Anesthetic complications: no   No notable events documented.   Last Vitals:  Vitals:   07/02/21 1300 07/02/21 1329  BP: (!) 154/87 (!) 174/92  Pulse: 73 62  Resp: 17 17  Temp:  37 C  SpO2: 98% 98%    Last Pain:  Vitals:   07/02/21 1329  TempSrc: Temporal  PainSc: 0-No pain                 Ahmaud Duthie

## 2021-07-02 NOTE — H&P (Signed)
Danny Pham [id number] 01-Mar-1961     HPI:  Healthy 60 y/o male with a left inguinal hernia. For repair with prosthetic mesh.   Medications Prior to Admission  Medication Sig Dispense Refill Last Dose   BELBUCA 300 MCG FILM Take 300 mcg by mouth every 12 (twelve) hours.   07/02/2021 at 0600   Citrulline POWD Take 1 Dose by mouth 2 (two) times daily. 1 = 1/2 teaspoon   Past Week   gabapentin (NEURONTIN) 300 MG capsule Take 300 mg by mouth 2 (two) times daily as needed (pain).   Past Week   hydrochlorothiazide (HYDRODIURIL) 25 MG tablet Take 25 mg by mouth daily.   07/01/2021   ramipril (ALTACE) 10 MG capsule Take 10 mg by mouth daily.   Past Week   tiZANidine (ZANAFLEX) 4 MG tablet Take 8 mg by mouth daily as needed for muscle spasms.   Past Week   Allergies  Allergen Reactions   Adhesive [Tape] Rash    OK to use paper tape   Phenergan [Promethazine Hcl] Other (See Comments)    Restless leg   Past Medical History:  Diagnosis Date   Anxiety    Back pain 2008   Hypertension    Past Surgical History:  Procedure Laterality Date   BACK SURGERY  2008   x2   HERNIA REPAIR Right 1990   Dr Laymond Purser HERNIA REPAIR Right 10/03/2017   Medium Ultra Pro mesh. Surgeon: Earline Mayotte, MD;  Location: [facility] ORS;  Service: General;  Laterality: Right;   VASECTOMY  1986   Social History   Socioeconomic History   Marital status: Married    Spouse name: Not on file   Number of children: 1   Years of education: Not on file   Highest education level: 12th grade  Occupational History   Occupation: Disabled    Comment: Due to back pain    Comment: retired  Tobacco Use   Smoking status: Never   Smokeless tobacco: Former    Types: Chew   Tobacco comments:    quit chewing over 20 years ago  Vaping Use   Vaping Use: Never used  Substance and Sexual Activity   Alcohol use: No    Alcohol/week: 0.0 standard drinks   Drug use: No   Sexual activity: Not on file  Other Topics  Concern   Not on file  Social History Narrative   Not on file   Social Determinants of Health   Financial Resource Strain: Not on file  Food Insecurity: Not on file  Transportation Needs: Not on file  Physical Activity: Not on file  Stress: Not on file  Social Connections: Not on file  Intimate Partner Violence: Not on file   Social History   Social History Narrative   Not on file     ROS: Negative.     PE: HEENT: Negative. Lungs: Clear. Cardio: RR.  Assessment/Plan:  Proceed with planned left inguinal hernia repair.  Merrily Pew [facility] 07/02/2021

## 2021-07-02 NOTE — Op Note (Signed)
Preoperative diagnosis: Left inguinal hernia.  Postoperative diagnosis: Same.  Operative procedure: Left inguinal hernia repair with medium Ultra Pro mesh.  Operating Surgeon: Donnalee Curry, MD.  Anesthesia: General by LMA, Marcaine 0.5% with 1: 200,000 units of epinephrine, 30 cc; Toradol: 30 mg.  Estimated blood loss: Less than 5 cc.  Clinical note: This 60 year old male is developed a symptomatic left inguinal hernia.  Previous right inguinal hernia repair in 1990, recurrence in 2018.  He received Ancef prior to the procedure.  SCD stockings for DVT prevention.  Operative note: The patient underwent general anesthesia and tolerated this well.  The abdomen was cleansed with ChloraPrep and draped.  Hered previously been removed with clippers.  Field block anesthesia was established.  A 5 cm skin line incision along the anticipated course the inguinal canal was carried down through skin subtendinous tissue with hemostasis achieved by electrocautery and 3-0 Vicryl ties.  The external oblique was opened in the direction of its fibers.  The cord was mobilized and a indirect sac identified.  The medial aspect of the inguinal canal appeared intact.  The sac was mobilized into the preperitoneal space.  The undersurface was cleared and a medium Ultra Pro mesh smoothed in position.  A lateral slit was made for cord passage.  The mesh was anchored to the pubic tubercle with 0 Surgilon followed by interrupted 0 Surgilon sutures along the inguinal ligament.  The medial and superior borders were anchored to the transverse abdominis fascia in similar fashion.  The cord was returned to its bed.  The external oblique was closed with a running 2-0 Vicryl suture.  Scarpa's fascia was closed with a running 3-0 Vicryl suture and the skin closed with a running 4-0 Vicryl subcuticular suture.  Benzoin, Steri-Strips, Telfa and Tegaderm dressings applied.  Patient tolerated procedure well and was taken to recovery in  stable condition.

## 2021-07-02 NOTE — Anesthesia Preprocedure Evaluation (Signed)
Anesthesia Evaluation  Patient identified by MRN, date of birth, ID band Patient awake    Reviewed: Allergy & Precautions, NPO status , Patient's Chart, lab work & pertinent test results  History of Anesthesia Complications Negative for: history of anesthetic complications  Airway Mallampati: II  TM Distance: >3 FB Neck ROM: Full    Dental  (+) Poor Dentition   Pulmonary neg pulmonary ROS, neg sleep apnea, neg COPD,    breath sounds clear to auscultation- rhonchi (-) wheezing      Cardiovascular Exercise Tolerance: Good hypertension, Pt. on medications (-) CAD, (-) Past MI, (-) Cardiac Stents and (-) CABG  Rhythm:Regular Rate:Normal - Systolic murmurs and - Diastolic murmurs    Neuro/Psych neg Seizures Anxiety negative neurological ROS     GI/Hepatic negative GI ROS, Neg liver ROS,   Endo/Other  negative endocrine ROSneg diabetes  Renal/GU negative Renal ROS     Musculoskeletal  (+) Arthritis ,   Abdominal (+) - obese,   Peds  Hematology negative hematology ROS (+)   Anesthesia Other Findings Past Medical History: No date: Anxiety 2008: Back pain No date: Hypertension   Reproductive/Obstetrics                             Anesthesia Physical Anesthesia Plan  ASA: 2  Anesthesia Plan: General   Post-op Pain Management:    Induction: Intravenous  PONV Risk Score and Plan: 1 and Ondansetron and Dexamethasone  Airway Management Planned: LMA  Additional Equipment:   Intra-op Plan:   Post-operative Plan:   Informed Consent: I have reviewed the patients History and Physical, chart, labs and discussed the procedure including the risks, benefits and alternatives for the proposed anesthesia with the patient or authorized representative who has indicated his/her understanding and acceptance.     Dental advisory given  Plan Discussed with: CRNA and Anesthesiologist  Anesthesia  Plan Comments:         Anesthesia Quick Evaluation

## 2021-07-02 NOTE — Discharge Instructions (Signed)

## 2021-07-05 ENCOUNTER — Encounter: Payer: Self-pay | Admitting: General Surgery

## 2021-07-06 ENCOUNTER — Other Ambulatory Visit: Payer: Self-pay | Admitting: Family Medicine

## 2021-07-06 NOTE — Telephone Encounter (Signed)
Requested medications are on the active medication list yes, but by historical provider  Last refill 7/17  Last visit 06/04/21  Future visit scheduled no  Notes to clinic Historical Provider, please assess.

## 2021-08-09 DIAGNOSIS — H43811 Vitreous degeneration, right eye: Secondary | ICD-10-CM | POA: Diagnosis not present

## 2022-02-08 ENCOUNTER — Telehealth: Payer: Self-pay | Admitting: Family Medicine

## 2022-02-08 NOTE — Telephone Encounter (Signed)
Copied from [place] [phone]. Topic: Medicare AWV ?>> Feb 08, 2022 10:21 AM Cher Nakai R wrote: ?Reason for CRM:  ?Left message for patient to call back and schedule Medicare Annual Wellness Visit (AWV) in office.  ? ?If not able to come in office, please offer to do virtually or by telephone.  ? ?Last AWV: 08/28/2019 ? ?Please schedule at anytime with [facility] Health Advisor. ? ?If any questions, please contact me at [phone] ?

## 2022-02-10 ENCOUNTER — Ambulatory Visit (INDEPENDENT_AMBULATORY_CARE_PROVIDER_SITE_OTHER): Payer: Medicare PPO

## 2022-02-10 VITALS — Ht 68.0 in | Wt 151.0 lb

## 2022-02-10 DIAGNOSIS — Z0000 Encounter for general adult medical examination without abnormal findings: Secondary | ICD-10-CM

## 2022-02-10 NOTE — Patient Instructions (Addendum)
[name] , ?Thank you for taking time to come for your Medicare Wellness Visit. I appreciate your ongoing commitment to your health goals. Please review the following plan we discussed and let me know if I can assist you in the future.  ? ?Screening recommendations/referrals: ?Colonoscopy: n/d ?Recommended yearly ophthalmology/optometry visit for glaucoma screening and checkup ?Recommended yearly dental visit for hygiene and checkup ? ?Vaccinations: ?Influenza vaccine: n/d ?Pneumococcal vaccine: n/d ?Tdap vaccine: 02/12/13 ?Shingles vaccine: n/d   ?Covid-19: n/d ? ?Advanced directives: no ? ?Conditions/risks identified: none ? ?Next appointment: Follow up in one year for your annual wellness visit - 02/15/23 @ 3:30pm by phone ? ?Preventive Care 40-64 Years, Male ?Preventive care refers to lifestyle choices and visits with your health care provider that can promote health and wellness. ?What does preventive care include? ?A yearly physical exam. This is also called an annual well check. ?Dental exams once or twice a year. ?Routine eye exams. Ask your health care provider how often you should have your eyes checked. ?Personal lifestyle choices, including: ?Daily care of your teeth and gums. ?Regular physical activity. ?Eating a healthy diet. ?Avoiding tobacco and drug use. ?Limiting alcohol use. ?Practicing safe sex. ?Taking low-dose aspirin every day starting at age 14. ?What happens during an annual well check? ?The services and screenings done by your health care provider during your annual well check will depend on your age, overall health, lifestyle risk factors, and family history of disease. ?Counseling  ?Your health care provider may ask you questions about your: ?Alcohol use. ?Tobacco use. ?Drug use. ?Emotional well-being. ?Home and relationship well-being. ?Sexual activity. ?Eating habits. ?Work and work Astronomer. ?Screening  ?You may have the following tests or measurements: ?Height, weight, and  BMI. ?Blood pressure. ?Lipid and cholesterol levels. These may be checked every 5 years, or more frequently if you are over 7 years old. ?Skin check. ?Lung cancer screening. You may have this screening every year starting at age 38 if you have a 30-pack-year history of smoking and currently smoke or have quit within the past 15 years. ?Fecal occult blood test (FOBT) of the stool. You may have this test every year starting at age 59. ?Flexible sigmoidoscopy or colonoscopy. You may have a sigmoidoscopy every 5 years or a colonoscopy every 10 years starting at age 47. ?Prostate cancer screening. Recommendations will vary depending on your family history and other risks. ?Hepatitis C blood test. ?Hepatitis B blood test. ?Sexually transmitted disease (STD) testing. ?Diabetes screening. This is done by checking your blood sugar (glucose) after you have not eaten for a while (fasting). You may have this done every 1-3 years. ?Discuss your test results, treatment options, and if necessary, the need for more tests with your health care provider. ?Vaccines  ?Your health care provider may recommend certain vaccines, such as: ?Influenza vaccine. This is recommended every year. ?Tetanus, diphtheria, and acellular pertussis (Tdap, Td) vaccine. You may need a Td booster every 10 years. ?Zoster vaccine. You may need this after age 20. ?Pneumococcal 13-valent conjugate (PCV13) vaccine. You may need this if you have certain conditions and have not been vaccinated. ?Pneumococcal polysaccharide (PPSV23) vaccine. You may need one or two doses if you smoke cigarettes or if you have certain conditions. ?Talk to your health care provider about which screenings and vaccines you need and how often you need them. ?This information is not intended to replace advice given to you by your health care provider. Make sure you discuss any questions you have with  your health care provider. ?Document Released: 12/04/2015 Document Revised: 07/27/2016  Document Reviewed: 09/08/2015 ?Elsevier Interactive Patient Education ? 2017 [place]. ? ?Fall Prevention in the Home ?Falls can cause injuries. They can happen to people of all ages. There are many things you can do to make your home safe and to help prevent falls. ?What can I do on the outside of my home? ?Regularly fix the edges of walkways and driveways and fix any cracks. ?Remove anything that might make you trip as you walk through a door, such as a raised step or threshold. ?Trim any bushes or trees on the path to your home. ?Use bright outdoor lighting. ?Clear any walking paths of anything that might make someone trip, such as rocks or tools. ?Regularly check to see if handrails are loose or broken. Make sure that both sides of any steps have handrails. ?Any raised decks and porches should have guardrails on the edges. ?Have any leaves, snow, or ice cleared regularly. ?Use sand or salt on walking paths during winter. ?Clean up any spills in your garage right away. This includes oil or grease spills. ?What can I do in the bathroom? ?Use night lights. ?Install grab bars by the toilet and in the tub and shower. Do not use towel bars as grab bars. ?Use non-skid mats or decals in the tub or shower. ?If you need to sit down in the shower, use a plastic, non-slip stool. ?Keep the floor dry. Clean up any water that spills on the floor as soon as it happens. ?Remove soap buildup in the tub or shower regularly. ?Attach bath mats securely with double-sided non-slip rug tape. ?Do not have throw rugs and other things on the floor that can make you trip. ?What can I do in the bedroom? ?Use night lights. ?Make sure that you have a light by your bed that is easy to reach. ?Do not use any sheets or blankets that are too big for your bed. They should not hang down onto the floor. ?Have a firm chair that has side arms. You can use this for support while you get dressed. ?Do not have throw rugs and other things on the floor  that can make you trip. ?What can I do in the kitchen? ?Clean up any spills right away. ?Avoid walking on wet floors. ?Keep items that you use a lot in easy-to-reach places. ?If you need to reach something above you, use a strong step stool that has a grab bar. ?Keep electrical cords out of the way. ?Do not use floor polish or wax that makes floors slippery. If you must use wax, use non-skid floor wax. ?Do not have throw rugs and other things on the floor that can make you trip. ?What can I do with my stairs? ?Do not leave any items on the stairs. ?Make sure that there are handrails on both sides of the stairs and use them. Fix handrails that are broken or loose. Make sure that handrails are as long as the stairways. ?Check any carpeting to make sure that it is firmly attached to the stairs. Fix any carpet that is loose or worn. ?Avoid having throw rugs at the top or bottom of the stairs. If you do have throw rugs, attach them to the floor with carpet tape. ?Make sure that you have a light switch at the top of the stairs and the bottom of the stairs. If you do not have them, ask someone to add them for you. ?What else  can I do to help prevent falls? ?Wear shoes that: ?Do not have high heels. ?Have rubber bottoms. ?Are comfortable and fit you well. ?Are closed at the toe. Do not wear sandals. ?If you use a stepladder: ?Make sure that it is fully opened. Do not climb a closed stepladder. ?Make sure that both sides of the stepladder are locked into place. ?Ask someone to hold it for you, if possible. ?Clearly mark and make sure that you can see: ?Any grab bars or handrails. ?First and last steps. ?Where the edge of each step is. ?Use tools that help you move around (mobility aids) if they are needed. These include: ?Canes. ?Walkers. ?Scooters. ?Crutches. ?Turn on the lights when you go into a dark area. Replace any light bulbs as soon as they burn out. ?Set up your furniture so you have a clear path. Avoid moving your  furniture around. ?If any of your floors are uneven, fix them. ?If there are any pets around you, be aware of where they are. ?Review your medicines with your doctor. Some medicines can make you feel dizzy.

## 2022-02-10 NOTE — Progress Notes (Signed)
?Virtual Visit via Telephone Note ? ?I connected with  Danny Pham on 02/10/22 at  3:40 PM EDT by telephone and verified that I am speaking with the correct person using two identifiers. ? ?Location: ?Patient: home ?Provider: [facility] ?Persons participating in the virtual visit: patient/Nurse Health Advisor ?  ?I discussed the limitations, risks, security and privacy concerns of performing an evaluation and management service by telephone and the availability of in person appointments. The patient expressed understanding and agreed to proceed. ? ?Interactive audio and video telecommunications were attempted between this nurse and patient, however failed, due to patient having technical difficulties OR patient did not have access to video capability.  We continued and completed visit with audio only. ? ?Some vital signs may be absent or patient reported.  ? ?Hal Hope, LPN ? ?Subjective:  ? Danny Pham is a 61 y.o. male who presents for Medicare Annual/Subsequent preventive examination. ? ?Review of Systems    ? ?  ? ?   ?Objective:  ?  ?Today's Vitals  ? 02/10/22 1542  ?Weight: 151 lb (68.5 kg)  ?Height: 5\' 8"  (1.727 m)  ? ?Body mass index is 22.96 kg/m?. ? ? ?  07/02/2021  ? 10:04 AM 06/29/2021  ? 11:22 AM 08/28/2019  ?  8:55 AM 12/26/2017  ?  8:55 AM 10/03/2017  ?  8:33 AM 09/25/2017  ?  8:02 AM  ?Advanced Directives  ?Does Patient Have a Medical Advance Directive? No No No No No No  ?Would patient like information on creating a medical advance directive? No - Patient declined No - Patient declined No - Patient declined No - Patient declined No - Patient declined No - Patient declined  ? ? ?Current Medications (verified) ?Outpatient Encounter Medications as of 02/10/2022  ?Medication Sig  ? BELBUCA 300 MCG FILM Take 300 mcg by mouth every 12 (twelve) hours.  ? Citrulline POWD Take 1 Dose by mouth 2 (two) times daily. 1 = 1/2 teaspoon  ? gabapentin (NEURONTIN) 300 MG capsule Take 300 mg by mouth 2 (two) times  daily as needed (pain).  ? hydrochlorothiazide (HYDRODIURIL) 25 MG tablet Take 25 mg by mouth daily.  ? HYDROcodone-acetaminophen (NORCO/VICODIN) 5-325 MG tablet Take 1 tablet by mouth every 4 (four) hours as needed for moderate pain.  ? ramipril (ALTACE) 10 MG capsule TAKE 1 CAPSULE BY MOUTH EVERY DAY  ? tiZANidine (ZANAFLEX) 4 MG tablet Take 8 mg by mouth daily as needed for muscle spasms.  ? ?No facility-administered encounter medications on file as of 02/10/2022.  ? ? ?Allergies (verified) ?Adhesive [tape] and Phenergan [promethazine hcl]  ? ?History: ?Past Medical History:  ?Diagnosis Date  ? Anxiety   ? Back pain 2008  ? Hypertension   ? ?Past Surgical History:  ?Procedure Laterality Date  ? BACK SURGERY  2008  ? x2  ? HERNIA REPAIR Right 1990  ? Dr 2009  ? INGUINAL HERNIA REPAIR Right 10/03/2017  ? Medium Ultra Pro mesh. Surgeon: 10/05/2017, MD;  Location: [facility];  Service: General;  Laterality: Right;  ? INGUINAL HERNIA REPAIR Left 07/02/2021  ? Procedure: HERNIA REPAIR INGUINAL ADULT WITH PROSTHETIC MESH;  Surgeon: 09/01/2021, MD;  Location: [facility];  Service: General;  Laterality: Left;  ? VASECTOMY  1986  ? ?Family History  ?Problem Relation Age of Onset  ? Diabetes Mother   ?     type 2   ? Hypertension Mother   ? COPD Mother   ?  Congestive Heart Failure Mother   ? Diabetes Father   ?     type 2  ? Hypertension Father   ? Dementia Father   ? CAD Other   ? Colon cancer Neg Hx   ? ?Social History  ? ?Socioeconomic History  ? Marital status: Married  ?  Spouse name: Not on file  ? Number of children: 1  ? Years of education: Not on file  ? Highest education level: 12th grade  ?Occupational History  ? Occupation: Disabled  ?  Comment: Due to back pain  ?  Comment: retired  ?Tobacco Use  ? Smoking status: Never  ? Smokeless tobacco: Former  ?  Types: Chew  ? Tobacco comments:  ?  quit chewing over 20 years ago  ?Vaping Use  ? Vaping Use: Never used  ?Substance and Sexual Activity  ? Alcohol  use: No  ?  Alcohol/week: 0.0 standard drinks  ? Drug use: No  ? Sexual activity: Not on file  ?Other Topics Concern  ? Not on file  ?Social History Narrative  ? Not on file  ? ?Social Determinants of Health  ? ?Financial Resource Strain: Not on file  ?Food Insecurity: Not on file  ?Transportation Needs: Not on file  ?Physical Activity: Not on file  ?Stress: Not on file  ?Social Connections: Not on file  ? ? ?Tobacco Counseling ?Counseling given: Not Answered ?Tobacco comments: quit chewing over 20 years ago ? ? ?Clinical Intake: ? ?Pre-visit preparation completed: Yes ? ?  ? ?  ? ?Nutritional Risks: None ?Diabetes: No ? ?How often do you need to have someone help you when you read instructions, pamphlets, or other written materials from your doctor or pharmacy?: 1 - Never ? ?Diabetic?no ? ?Interpreter Needed?: No ? ?Information entered by :: Kennedy Bucker, LPN ? ? ?Activities of Daily Living ? ?  06/29/2021  ? 11:24 AM 06/29/2021  ? 11:21 AM  ?In your present state of health, do you have any difficulty performing the following activities:  ?Hearing?  0  ?Vision?  0  ?Comment  wears glasses  ?Difficulty concentrating or making decisions?  0  ?Walking or climbing stairs?  1  ?Comment  some due to back injury  ?Dressing or bathing?  0  ?Doing errands, shopping? 0   ? ? ?Patient Care Team: ?Malva Limes, MD as PCP - General (Family Medicine) ?Marzetta Board, PA-C as Physician Assistant (Physician Assistant) ?Earline Mayotte, MD as Consulting Physician (General Surgery) ? ?Indicate any recent Medical Services you may have received from other than [facility] providers in the past year (date may be approximate). ? ?   ?Assessment:  ? This is a routine wellness examination for Danny Pham. ? ?Hearing/Vision screen ?No results found. ? ?Dietary issues and exercise activities discussed: ?  ? ? Goals Addressed   ?None ?  ? ?Depression Screen ? ?  06/04/2021  ?  3:41 PM 08/28/2019  ?  8:55 AM 12/26/2017  ?  9:06 AM 12/26/2017  ?  8:58 AM  10/18/2016  ?  9:00 AM 09/22/2015  ?  9:13 AM  ?PHQ 2/9 Scores  ?PHQ - 2 Score 0 0  0 0 0  ?PHQ- 9 Score      0  ?Exception Documentation   Patient refusal     ?  ?Fall Risk ? ?  12/03/2019  ?  8:41 AM 08/28/2019  ?  8:55 AM 12/26/2017  ?  8:58 AM 10/18/2016  ?  9:00  AM 09/22/2015  ?  9:13 AM  ?Fall Risk   ?Falls in the past year? 0 0 No No No  ?Number falls in past yr: 0 0     ?Injury with Fall? 0 0     ? ? ?FALL RISK PREVENTION PERTAINING TO THE HOME: ? ?Any stairs in or around the home? No  ?If so, are there any without handrails? Yes  ?Home free of loose throw rugs in walkways, pet beds, electrical cords, etc? Yes  ?Adequate lighting in your home to reduce risk of falls? Yes  ? ?ASSISTIVE DEVICES UTILIZED TO PREVENT FALLS: ? ?Life alert? No  ?Use of a cane, walker or w/c? No  ?Grab bars in the bathroom? No  ?Shower chair or bench in shower? No  ?Elevated toilet seat or a handicapped toilet? No  ? ?Cognitive Function:Normal cognitive status assessed by direct observation by this Nurse Health Advisor. No abnormalities found.  ? ?  ?  ?  ? ?Immunizations ?Immunization History  ?Administered Date(s) Administered  ? Tdap 02/12/2013  ? ? ?TDAP status: Up to date ? ?Flu Vaccine status: Declined, Education has been provided regarding the importance of this vaccine but patient still declined. Advised may receive this vaccine at local pharmacy or Health Dept. Aware to provide a copy of the vaccination record if obtained from local pharmacy or Health Dept. Verbalized acceptance and understanding. ? ?Pneumococcal vaccine status: Declined,  Education has been provided regarding the importance of this vaccine but patient still declined. Advised may receive this vaccine at local pharmacy or Health Dept. Aware to provide a copy of the vaccination record if obtained from local pharmacy or Health Dept. Verbalized acceptance and understanding.  ? ?Covid-19 vaccine status: Declined, Education has been provided regarding the importance of  this vaccine but patient still declined. Advised may receive this vaccine at local pharmacy or Health Dept.or vaccine clinic. Aware to provide a copy of the vaccination record if obtained from local pharma

## 2022-07-11 ENCOUNTER — Telehealth: Payer: Self-pay | Admitting: Family Medicine

## 2022-07-11 MED ORDER — TADALAFIL 5 MG PO TABS: 5.0000 mg | ORAL_TABLET | Freq: Every day | ORAL | 11 refills | Status: DC

## 2022-07-11 NOTE — Telephone Encounter (Signed)
Pt states that he was unable to fill med last time for erectile dysfunction as was $800.00. States his brother is having success with Tadalafil lowest dosage 2.5 mg tablet. He wants to know if he can try as with [facility]. Would need to be called into  [facility] [address] [facility], [place] - [address] Phone:  [phone]  Fax:  [phone]    Pt states if any issues to call him at [phone]

## 2022-08-30 ENCOUNTER — Ambulatory Visit: Payer: Self-pay | Admitting: *Deleted

## 2022-08-30 MED ORDER — TADALAFIL 5 MG PO TABS: 5.0000 mg | ORAL_TABLET | Freq: Every day | ORAL | 11 refills | Status: DC

## 2022-08-30 NOTE — Telephone Encounter (Signed)
Summary: Medication Increase   Patient wants to increase his tadalafil (CIALIS) 5 MG tablet to 2 5MG  tablets per day (10 MG total). Please advise.       Called patient to review request for medication dose increase. Last BP elevated during OV. No answer, LVMTCB [phone].

## 2022-08-30 NOTE — Telephone Encounter (Signed)
  Chief Complaint: requesting dose change Symptoms: pt feels like erection not the way he wants it to be Frequency: n/a Pertinent Negatives: Patient denies n/a Disposition: [] ED /[] Urgent Care (no appt availability in office) / [] Appointment(In office/virtual)/ []  [facility]/ [] Home Care/ [] Refused Recommended Disposition /[] [facility]/ [x]  Follow-up with PCP Additional Notes: last BP's WNL: 120/80 and 114/78 Please send to [facility] in [place]. Please call pt at [phone] for follow up Reason for Disposition  [1] Caller has NON-URGENT medicine question about med that PCP prescribed AND [2] triager unable to answer question  Answer Assessment - Initial Assessment Questions 1. NAME of MEDICINE: "What medicine(s) are you calling about?"     Tadalafil 5 mg twice a day per [facility] the copay 30 day Single care: 22.96 2. QUESTION: "What is your question?" (e.g., double dose of medicine, side effect)     Dose increase 3. PRESCRIBER: "Who prescribed the medicine?" Reason: if prescribed by specialist, call should be referred to that group.     *No Answer* 4. SYMPTOMS: "Do you have any symptoms?" If Yes, ask: "What symptoms are you having?"  "How bad are the symptoms (e.g., mild, moderate, severe)     Feels like erection could be better 5. PREGNANCY:  "Is there any chance that you are pregnant?" "When was your last menstrual period?"     N/a  Protocols used: Medication Question Call-A-AH

## 2022-08-30 NOTE — Addendum Note (Signed)
Addended by: Birdie Sons on: 08/30/2022 10:35 AM   Modules accepted: Orders

## 2023-02-15 ENCOUNTER — Ambulatory Visit (INDEPENDENT_AMBULATORY_CARE_PROVIDER_SITE_OTHER): Payer: Medicare PPO

## 2023-02-15 VITALS — Ht 68.0 in | Wt 171.0 lb

## 2023-02-15 DIAGNOSIS — Z0000 Encounter for general adult medical examination without abnormal findings: Secondary | ICD-10-CM

## 2023-02-15 NOTE — Patient Instructions (Signed)
Danny Pham , Thank you for taking time to come for your Medicare Wellness Visit. I appreciate your ongoing commitment to your health goals. Please review the following plan we discussed and let me know if I can assist you in the future.   These are the goals we discussed:  Goals      DIET - EAT MORE FRUITS AND VEGETABLES     Weight (lb) < 185 lb (83.9 kg)     Recommend to continue current diet regimen and cutting out fats to aid in weight loss of 15 or more pounds.         This is a list of the screening recommended for you and due dates:  Health Maintenance  Topic Date Due   COVID-19 Vaccine (1) Never done   HIV Screening  Never done   Zoster (Shingles) Vaccine (1 of 2) Never done   Colon Cancer Screening  Never done   Stool Blood Test  12/27/2018   Flu Shot  Never done   DTaP/Tdap/Td vaccine (2 - Td or Tdap) 02/13/2023   Medicare Annual Wellness Visit  02/15/2024   Hepatitis C Screening: USPSTF Recommendation to screen - Ages 18-79 yo.  Completed   HPV Vaccine  Aged Out    Advanced directives: no  Conditions/risks identified: low falls risk  Next appointment: Follow up in one year for your annual wellness visit 02/20/2024 @3pm   Preventive Care 40-64 Years, Male Preventive care refers to lifestyle choices and visits with your health care provider that can promote health and wellness. What does preventive care include? A yearly physical exam. This is also called an annual well check. Dental exams once or twice a year. Routine eye exams. Ask your health care provider how often you should have your eyes checked. Personal lifestyle choices, including: Daily care of your teeth and gums. Regular physical activity. Eating a healthy diet. Avoiding tobacco and drug use. Limiting alcohol use. Practicing safe sex. Taking low-dose aspirin every day starting at age 68. What happens during an annual well check? The services and screenings done by your health care provider during  your annual well check will depend on your age, overall health, lifestyle risk factors, and family history of disease. Counseling  Your health care provider may ask you questions about your: Alcohol use. Tobacco use. Drug use. Emotional well-being. Home and relationship well-being. Sexual activity. Eating habits. Work and work Statistician. Screening  You may have the following tests or measurements: Height, weight, and BMI. Blood pressure. Lipid and cholesterol levels. These may be checked every 5 years, or more frequently if you are over 5 years old. Skin check. Lung cancer screening. You may have this screening every year starting at age 58 if you have a 30-pack-year history of smoking and currently smoke or have quit within the past 15 years. Fecal occult blood test (FOBT) of the stool. You may have this test every year starting at age 11. Flexible sigmoidoscopy or colonoscopy. You may have a sigmoidoscopy every 5 years or a colonoscopy every 10 years starting at age 67. Prostate cancer screening. Recommendations will vary depending on your family history and other risks. Hepatitis C blood test. Hepatitis B blood test. Sexually transmitted disease (STD) testing. Diabetes screening. This is done by checking your blood sugar (glucose) after you have not eaten for a while (fasting). You may have this done every 1-3 years. Discuss your test results, treatment options, and if necessary, the need for more tests with your health care  provider. Vaccines  Your health care provider may recommend certain vaccines, such as: Influenza vaccine. This is recommended every year. Tetanus, diphtheria, and acellular pertussis (Tdap, Td) vaccine. You may need a Td booster every 10 years. Zoster vaccine. You may need this after age 2. Pneumococcal 13-valent conjugate (PCV13) vaccine. You may need this if you have certain conditions and have not been vaccinated. Pneumococcal polysaccharide (PPSV23)  vaccine. You may need one or two doses if you smoke cigarettes or if you have certain conditions. Talk to your health care provider about which screenings and vaccines you need and how often you need them. This information is not intended to replace advice given to you by your health care provider. Make sure you discuss any questions you have with your health care provider. Document Released: 12/04/2015 Document Revised: 07/27/2016 Document Reviewed: 09/08/2015 Elsevier Interactive Patient Education  2017 [place] Prevention in the Home Falls can cause injuries. They can happen to people of all ages. There are many things you can do to make your home safe and to help prevent falls. What can I do on the outside of my home? Regularly fix the edges of walkways and driveways and fix any cracks. Remove anything that might make you trip as you walk through a door, such as a raised step or threshold. Trim any bushes or trees on the path to your home. Use bright outdoor lighting. Clear any walking paths of anything that might make someone trip, such as rocks or tools. Regularly check to see if handrails are loose or broken. Make sure that both sides of any steps have handrails. Any raised decks and porches should have guardrails on the edges. Have any leaves, snow, or ice cleared regularly. Use sand or salt on walking paths during winter. Clean up any spills in your garage right away. This includes oil or grease spills. What can I do in the bathroom? Use night lights. Install grab bars by the toilet and in the tub and shower. Do not use towel bars as grab bars. Use non-skid mats or decals in the tub or shower. If you need to sit down in the shower, use a plastic, non-slip stool. Keep the floor dry. Clean up any water that spills on the floor as soon as it happens. Remove soap buildup in the tub or shower regularly. Attach bath mats securely with double-sided non-slip rug tape. Do not  have throw rugs and other things on the floor that can make you trip. What can I do in the bedroom? Use night lights. Make sure that you have a light by your bed that is easy to reach. Do not use any sheets or blankets that are too big for your bed. They should not hang down onto the floor. Have a firm chair that has side arms. You can use this for support while you get dressed. Do not have throw rugs and other things on the floor that can make you trip. What can I do in the kitchen? Clean up any spills right away. Avoid walking on wet floors. Keep items that you use a lot in easy-to-reach places. If you need to reach something above you, use a strong step stool that has a grab bar. Keep electrical cords out of the way. Do not use floor polish or wax that makes floors slippery. If you must use wax, use non-skid floor wax. Do not have throw rugs and other things on the floor that can make you trip.  What can I do with my stairs? Do not leave any items on the stairs. Make sure that there are handrails on both sides of the stairs and use them. Fix handrails that are broken or loose. Make sure that handrails are as long as the stairways. Check any carpeting to make sure that it is firmly attached to the stairs. Fix any carpet that is loose or worn. Avoid having throw rugs at the top or bottom of the stairs. If you do have throw rugs, attach them to the floor with carpet tape. Make sure that you have a light switch at the top of the stairs and the bottom of the stairs. If you do not have them, ask someone to add them for you. What else can I do to help prevent falls? Wear shoes that: Do not have high heels. Have rubber bottoms. Are comfortable and fit you well. Are closed at the toe. Do not wear sandals. If you use a stepladder: Make sure that it is fully opened. Do not climb a closed stepladder. Make sure that both sides of the stepladder are locked into place. Ask someone to hold it for  you, if possible. Clearly mark and make sure that you can see: Any grab bars or handrails. First and last steps. Where the edge of each step is. Use tools that help you move around (mobility aids) if they are needed. These include: Canes. Walkers. Scooters. Crutches. Turn on the lights when you go into a dark area. Replace any light bulbs as soon as they burn out. Set up your furniture so you have a clear path. Avoid moving your furniture around. If any of your floors are uneven, fix them. If there are any pets around you, be aware of where they are. Review your medicines with your doctor. Some medicines can make you feel dizzy. This can increase your chance of falling. Ask your doctor what other things that you can do to help prevent falls. This information is not intended to replace advice given to you by your health care provider. Make sure you discuss any questions you have with your health care provider. Document Released: 09/03/2009 Document Revised: 04/14/2016 Document Reviewed: 12/12/2014 Elsevier Interactive Patient Education  2017 [employer].

## 2023-02-15 NOTE — Progress Notes (Signed)
I connected with  Danny Pham on 02/15/23 by a audio enabled telemedicine application and verified that I am speaking with the correct person using two identifiers.  Patient Location: Home  Provider Location: Office/Clinic  I discussed the limitations of evaluation and management by telemedicine. The patient expressed understanding and agreed to proceed.  Subjective:   Danny Pham is a 62 y.o. male who presents for Medicare Annual/Subsequent preventive examination.  Review of Systems    Cardiac Risk Factors include: advanced age (>83men, >80 women);hypertension;male gender    Objective:    Today's Vitals   02/15/23 1511  Weight: 171 lb (77.6 kg)  Height: 5\' 8"  (1.727 m)   Body mass index is 26 kg/m.     02/15/2023    3:21 PM 02/10/2022    3:52 PM 07/02/2021   10:04 AM 06/29/2021   11:22 AM 08/28/2019    8:55 AM 12/26/2017    8:55 AM 10/03/2017    8:33 AM  Advanced Directives  Does Patient Have a Medical Advance Directive? No No No No No No No  Would patient like information on creating a medical advance directive?  No - Patient declined No - Patient declined No - Patient declined No - Patient declined No - Patient declined No - Patient declined    Current Medications (verified) Outpatient Encounter Medications as of 02/15/2023  Medication Sig   BELBUCA 300 MCG FILM Take 300 mcg by mouth every 12 (twelve) hours.   Citrulline POWD Take 1 Dose by mouth 2 (two) times daily. 1 = 1/2 teaspoon   gabapentin (NEURONTIN) 300 MG capsule Take 300 mg by mouth 2 (two) times daily as needed (pain).   hydrochlorothiazide (HYDRODIURIL) 25 MG tablet Take 25 mg by mouth daily.   ramipril (ALTACE) 10 MG capsule TAKE 1 CAPSULE BY MOUTH EVERY DAY   tadalafil (CIALIS) 5 MG tablet Take 1-2 tablets (5-10 mg total) by mouth daily.   tiZANidine (ZANAFLEX) 4 MG tablet Take 8 mg by mouth daily as needed for muscle spasms.   No facility-administered encounter medications on file as of 02/15/2023.     Allergies (verified) Adhesive [tape] and Phenergan [promethazine hcl]   History: Past Medical History:  Diagnosis Date   Anxiety    Back pain 2008   Hypertension    Past Surgical History:  Procedure Laterality Date   BACK SURGERY  2008   x2   HERNIA REPAIR Right 1990   Dr Bhatti   [place] Right 10/03/2017   Medium Ultra Pro mesh. Surgeon: Robert Bellow, MD;  Location: [facility] ORS;  Service: General;  Laterality: Right;   INGUINAL HERNIA REPAIR Left 07/02/2021   Procedure: HERNIA REPAIR INGUINAL ADULT WITH PROSTHETIC MESH;  Surgeon: Robert Bellow, MD;  Location: [facility] ORS;  Service: General;  Laterality: Left;   VASECTOMY  1986   Family History  Problem Relation Age of Onset   Diabetes Mother        type 2    Hypertension Mother    COPD Mother    Congestive Heart Failure Mother    Diabetes Father        type 2   Hypertension Father    Dementia Father    CAD Other    Colon cancer Neg Hx    Social History   Socioeconomic History   Marital status: Married    Spouse name: Not on file   Number of children: 1   Years of education: Not on file  Highest education level: 12th grade  Occupational History   Occupation: Disabled    Comment: Due to back pain    Comment: retired  Tobacco Use   Smoking status: Never   Smokeless tobacco: Former    Types: Chew   Tobacco comments:    quit chewing over 20 years ago  Vaping Use   Vaping Use: Never used  Substance and Sexual Activity   Alcohol use: No    Alcohol/week: 0.0 standard drinks of alcohol   Drug use: No   Sexual activity: Not on file  Other Topics Concern   Not on file  Social History Narrative   Not on file   Social Determinants of Health   Financial Resource Strain: Low Risk  (02/15/2023)   Overall Financial Resource Strain (CARDIA)    Difficulty of Paying Living Expenses: Not hard at all  Food Insecurity: No Food Insecurity (02/15/2023)   Hunger Vital Sign    Worried About  Running Out of Food in the Last Year: Never true    Smoke Rise in the Last Year: Never true  Transportation Needs: No Transportation Needs (02/15/2023)   PRAPARE - Hydrologist (Medical): No    Lack of Transportation (Non-Medical): No  Physical Activity: Insufficiently Active (02/15/2023)   Exercise Vital Sign    Days of Exercise per Week: 3 days    Minutes of Exercise per Session: 30 min  Stress: No Stress Concern Present (02/15/2023)   [place]    Feeling of Stress : Only a little  Social Connections: Socially Isolated (02/15/2023)   Social Connection and Isolation Panel [NHANES]    Frequency of Communication with Friends and Family: Never    Frequency of Social Gatherings with Friends and Family: Never    Attends Religious Services: Never    Printmaker: No    Attends Music therapist: Never    Marital Status: Married    Tobacco Counseling Counseling given: Not Answered Tobacco comments: quit chewing over 20 years ago   Clinical Intake:  Pre-visit preparation completed: Yes  Pain : No/denies pain     BMI - recorded: 26 Nutritional Status: BMI 25 -29 Overweight Nutritional Risks: None Diabetes: No  How often do you need to have someone help you when you read instructions, pamphlets, or other written materials from your doctor or pharmacy?: 1 - Never  Diabetic?no  Interpreter Needed?: No  Comments: lives with wife and daughter Information entered by :: B.Rylyn Zawistowski,LPN   Activities of Daily Living    02/15/2023    3:21 PM  In your present state of health, do you have any difficulty performing the following activities:  Hearing? 0  Vision? 0  Difficulty concentrating or making decisions? 0  Walking or climbing stairs? 1  Dressing or bathing? 0  Doing errands, shopping? 1  Comment wife does  Conservation officer, nature and eating ? N   Using the Toilet? N  In the past six months, have you accidently leaked urine? N  Do you have problems with loss of bowel control? N  Managing your Medications? N  Managing your Finances? N  Housekeeping or managing your Housekeeping? N    Patient Care Team: Birdie Sons, MD as PCP - General (Family Medicine) Rae Mar as Physician Assistant (Physician Assistant) Danny Castilla Forest Gleason, MD as Consulting Physician (General Surgery)  Indicate any recent Medical Services you may have  received from other than [facility] providers in the past year (date may be approximate).     Assessment:   This is a routine wellness examination for Valerio.  Hearing/Vision screen Hearing Screening - Comments:: Adequate hearing Vision Screening - Comments:: Adequate vision w/glasses [facility]  Dietary issues and exercise activities discussed: Current Exercise Habits: Home exercise routine, Type of exercise: strength training/weights;stretching, Time (Minutes): 30, Frequency (Times/Week): 3, Weekly Exercise (Minutes/Week): 90, Exercise limited by: orthopedic condition(s)   Goals Addressed             This Visit's Progress    DIET - EAT MORE FRUITS AND VEGETABLES   On track      Depression Screen    02/15/2023    3:18 PM 02/10/2022    3:46 PM 06/04/2021    3:41 PM 08/28/2019    8:55 AM 12/26/2017    9:06 AM 12/26/2017    8:58 AM 10/18/2016    9:00 AM  PHQ 2/9 Scores  PHQ - 2 Score 0 0 0 0  0 0  Exception Documentation     Patient refusal      Fall Risk    02/15/2023    3:14 PM 02/10/2022    3:53 PM 12/03/2019    8:41 AM 08/28/2019    8:55 AM 12/26/2017    8:58 AM  Fall Risk   Falls in the past year? 0 0 0 0 No  Number falls in past yr: 0 0 0 0   Injury with Fall? 0 0 0 0   Risk for fall due to : No Fall Risks No Fall Risks     Follow up Education provided;Falls prevention discussed Falls evaluation completed       FALL RISK PREVENTION PERTAINING TO THE HOME:  Any  stairs in or around the home? No  If so, are there any without handrails? No  Home free of loose throw rugs in walkways, pet beds, electrical cords, etc? Yes  Adequate lighting in your home to reduce risk of falls? Yes   ASSISTIVE DEVICES UTILIZED TO PREVENT FALLS:  Life alert? No  Use of a cane, walker or w/c? Yes cane as times for walking;not used in a year Grab bars in the bathroom? Yes  Shower chair or bench in shower? Yes  Elevated toilet seat or a handicapped toilet? No   Cognitive Function:        02/15/2023    3:24 PM  6CIT Screen  What Year? 0 points  What month? 0 points  What time? 0 points  Count back from 20 0 points  Months in reverse 0 points  Repeat phrase 2 points  Total Score 2 points    Immunizations Immunization History  Administered Date(s) Administered   Tdap 02/12/2013    TDAP status: Up to date  Flu Vaccine status: Declined, Education has been provided regarding the importance of this vaccine but patient still declined. Advised may receive this vaccine at local pharmacy or Health Dept. Aware to provide a copy of the vaccination record if obtained from local pharmacy or Health Dept. Verbalized acceptance and understanding.  Pneumococcal vaccine status: Declined,  Education has been provided regarding the importance of this vaccine but patient still declined. Advised may receive this vaccine at local pharmacy or Health Dept. Aware to provide a copy of the vaccination record if obtained from local pharmacy or Health Dept. Verbalized acceptance and understanding.   Covid-19 vaccine status: Declined, Education has been provided regarding the importance  of this vaccine but patient still declined. Advised may receive this vaccine at local pharmacy or Health Dept.or vaccine clinic. Aware to provide a copy of the vaccination record if obtained from local pharmacy or Health Dept. Verbalized acceptance and understanding.  Qualifies for Shingles Vaccine? Yes    Zostavax completed No   Shingrix Completed?: No.    Education has been provided regarding the importance of this vaccine. Patient has been advised to call insurance company to determine out of pocket expense if they have not yet received this vaccine. Advised may also receive vaccine at local pharmacy or Health Dept. Verbalized acceptance and understanding.  Screening Tests Health Maintenance  Topic Date Due   COVID-19 Vaccine (1) Never done   HIV Screening  Never done   Zoster Vaccines- Shingrix (1 of 2) Never done   COLONOSCOPY (Pts 45-34yrs Insurance coverage will need to be confirmed)  Never done   COLON CANCER SCREENING ANNUAL FOBT  12/27/2018   INFLUENZA VACCINE  Never done   DTaP/Tdap/Td (2 - Td or Tdap) 02/13/2023   Medicare Annual Wellness (AWV)  02/15/2024   Hepatitis C Screening  Completed   HPV VACCINES  Aged Out    Health Maintenance  Health Maintenance Due  Topic Date Due   COVID-19 Vaccine (1) Never done   HIV Screening  Never done   Zoster Vaccines- Shingrix (1 of 2) Never done   COLONOSCOPY (Pts 45-63yrs Insurance coverage will need to be confirmed)  Never done   COLON CANCER SCREENING ANNUAL FOBT  12/27/2018   INFLUENZA VACCINE  Never done   DTaP/Tdap/Td (2 - Td or Tdap) 02/13/2023    Colorectal cancer screening: No longer required. Pt declines  Lung Cancer Screening: (Low Dose CT Chest recommended if Age 36-80 years, 30 pack-year currently smoking OR have quit w/in 15years.) does not qualify.   Lung Cancer Screening Referral: no  Additional Screening:  Hepatitis C Screening: does not qualify; Completed yes  Vision Screening: Recommended annual ophthalmology exams for early detection of glaucoma and other disorders of the eye. Is the patient up to date with their annual eye exam?  Yes  Who is the provider or what is the name of the office in which the patient attends annual eye exams? [facility]  If pt is not established with a provider, would they  like to be referred to a provider to establish care? No .   Dental Screening: Recommended annual dental exams for proper oral hygiene  Community Resource Referral / Chronic Care Management: CRR required this visit?  No   CCM required this visit?  No      Plan:     I have personally reviewed and noted the following in the patient's chart:   Medical and social history Use of alcohol, tobacco or illicit drugs  Current medications and supplements including opioid prescriptions. Patient is not currently taking opioid prescriptions. Functional ability and status Nutritional status Physical activity Advanced directives List of other physicians Hospitalizations, surgeries, and ER visits in previous 12 months Vitals Screenings to include cognitive, depression, and falls Referrals and appointments  In addition, I have reviewed and discussed with patient certain preventive protocols, quality metrics, and best practice recommendations. A written personalized care plan for preventive services as well as general preventive health recommendations were provided to patient.     Roger Shelter, LPN   [id number]   Nurse Notes: pt doing alright..continues to manage chronic back pain. He hs no concerns or questions at this  time.

## 2023-06-01 NOTE — Progress Notes (Signed)
[facility] Quality Team Note  Name: Danny Pham Date of Birth: [birth year] MRN: [id number] Date: 06/01/2023  [facility] Quality Team has reviewed this patient's chart, please see recommendations below:  Patient Declined (Procedure or Test); Patient declined (COLON CANCER SCREENING. SPOKE WITH PATIENT TODAY 06/01/2023).

## 2023-09-20 ENCOUNTER — Other Ambulatory Visit: Payer: Self-pay | Admitting: Family Medicine

## 2023-09-21 NOTE — Telephone Encounter (Signed)
Requested medication (s) are due for refill today:yes  Requested medication (s) are on the active medication list: yes  Last refill:  08/30/22 #60 11 RF  Future visit scheduled: no  Notes to clinic:  overdue lab work   Requested Prescriptions  Pending Prescriptions Disp Refills   tadalafil (CIALIS) 5 MG tablet [Pharmacy Med Name: TADALAFIL 5MG  TABLETS] 60 tablet 11    Sig: TAKE 1 TO 2 TABLETS(5 TO 10 MG) BY MOUTH DAILY     Urology: Erectile Dysfunction Agents Failed - 09/20/2023  3:13 PM      Failed - AST in normal range and within 360 days    AST  Date Value Ref Range Status  12/03/2019 19 0 - 40 IU/L Final         Failed - ALT in normal range and within 360 days    ALT  Date Value Ref Range Status  12/03/2019 19 0 - 44 IU/L Final         Failed - Last BP in normal range    BP Readings from Last 1 Encounters:  07/02/21 (!) 174/92         Passed - Valid encounter within last 12 months    Recent Outpatient Visits           2 years ago Left inguinal hernia   [place] [facility] Malva Limes, MD   3 years ago Annual physical exam   [facility] Malva Limes, MD   3 years ago Acute prostatitis   [place] [facility], Marzella Schlein, MD   5 years ago Annual physical exam   [facility] Malva Limes, MD   6 years ago Right inguinal hernia   [facility] Malva Limes, MD

## 2023-09-25 ENCOUNTER — Other Ambulatory Visit: Payer: Self-pay | Admitting: Family Medicine

## 2023-09-26 ENCOUNTER — Other Ambulatory Visit: Payer: Self-pay | Admitting: Family Medicine

## 2023-09-26 ENCOUNTER — Telehealth: Payer: Self-pay | Admitting: Family Medicine

## 2023-09-26 MED ORDER — TADALAFIL 5 MG PO TABS: 5.0000 mg | ORAL_TABLET | Freq: Every day | ORAL | 11 refills | Status: DC | PRN

## 2023-09-26 NOTE — Telephone Encounter (Signed)
[facility]. Pharmacy staff state they did not get the refill request for  Requested Prescriptions   Pending Prescriptions Disp Refills   tadalafil (CIALIS) 5 MG tablet 60 tablet 11    Will resend to pharmacy.

## 2023-09-26 NOTE — Telephone Encounter (Signed)
Patient called stated he was denied a refill on the med tadalafil (CIALIS) 5 MG tablet and does not know why. Please f/u with patient or pharmacy to see if it was denied or not and let him know.

## 2023-09-26 NOTE — Telephone Encounter (Signed)
Refilled 09/26/23, duplicate request.  Requested Prescriptions  Pending Prescriptions Disp Refills   tadalafil (CIALIS) 5 MG tablet [Pharmacy Med Name: TADALAFIL 5MG  TABLETS] 60 tablet 11    Sig: TAKE 1 TO 2 TABLETS(5 TO 10 MG) BY MOUTH DAILY     Urology: Erectile Dysfunction Agents Failed - 09/26/2023 11:05 AM      Failed - AST in normal range and within 360 days    AST  Date Value Ref Range Status  12/03/2019 19 0 - 40 IU/L Final         Failed - ALT in normal range and within 360 days    ALT  Date Value Ref Range Status  12/03/2019 19 0 - 44 IU/L Final         Failed - Last BP in normal range    BP Readings from Last 1 Encounters:  07/02/21 (!) 174/92         Passed - Valid encounter within last 12 months    Recent Outpatient Visits           2 years ago Left inguinal hernia   [place] [facility] Malva Limes, MD   3 years ago Annual physical exam   [facility] Malva Limes, MD   3 years ago Acute prostatitis   [place] [facility], Marzella Schlein, MD   5 years ago Annual physical exam   [facility] Malva Limes, MD   6 years ago Right inguinal hernia   [place] [facility] Malva Limes, MD

## 2023-09-26 NOTE — Telephone Encounter (Signed)
Requested Prescriptions  Pending Prescriptions Disp Refills   tadalafil (CIALIS) 5 MG tablet 60 tablet 11    Sig: Take 1 tablet (5 mg total) by mouth daily as needed for erectile dysfunction. TAKE 1 TO 2 TABLETS(5 TO 10 MG) BY MOUTH DAILY     Urology: Erectile Dysfunction Agents Failed - 09/26/2023  1:19 PM      Failed - AST in normal range and within 360 days    AST  Date Value Ref Range Status  12/03/2019 19 0 - 40 IU/L Final         Failed - ALT in normal range and within 360 days    ALT  Date Value Ref Range Status  12/03/2019 19 0 - 44 IU/L Final         Failed - Last BP in normal range    BP Readings from Last 1 Encounters:  07/02/21 (!) 174/92         Passed - Valid encounter within last 12 months    Recent Outpatient Visits           2 years ago Left inguinal hernia   [place] [facility] Malva Limes, MD   3 years ago Annual physical exam   [facility] Malva Limes, MD   3 years ago Acute prostatitis   [place] [facility], Marzella Schlein, MD   5 years ago Annual physical exam   [facility] Malva Limes, MD   6 years ago Right inguinal hernia   [facility] Malva Limes, MD

## 2023-09-26 NOTE — Telephone Encounter (Signed)
Medication Refill - Medication:  tadalafil (CIALIS) 5 MG tablet  *almost out of medication   Has the patient contacted their pharmacy? Yes patient states he spoke to a live person at his pharmacy a few hours ago and they state they have not received the prescription, he states he has had trouble with this pharmacy. Advised patient it was showing pharmacy received medication at 1:21pm but since he is having this much trouble, I would send a message for him.  Preferred Pharmacy (with phone number or street name):  [id number] [place] - [address] Phone: [phone] Fax: [phone]  Has the patient been seen for an appointment in the last year OR does the patient have an upcoming appointment? Yes, saw Medicare Wellness nurse.

## 2023-09-26 NOTE — Telephone Encounter (Signed)
Called [facility] pharmacy again to confirm that they received the refill request for Tadalafil 5 mg. Spoke with pharmacy staff that states medication is was received and being processed.  Called patient, left VM that medication was at the pharmacy.

## 2023-09-26 NOTE — Telephone Encounter (Signed)
Patient called stated he was denied a refill and does not know why. Please f/u with patient or pharmacy to see if it was denied or not and let him known.

## 2023-10-16 DIAGNOSIS — H43813 Vitreous degeneration, bilateral: Secondary | ICD-10-CM | POA: Diagnosis not present

## 2023-10-16 DIAGNOSIS — H5213 Myopia, bilateral: Secondary | ICD-10-CM | POA: Diagnosis not present

## 2023-10-27 ENCOUNTER — Ambulatory Visit: Payer: Medicare PPO | Admitting: Family Medicine

## 2024-02-20 ENCOUNTER — Ambulatory Visit (INDEPENDENT_AMBULATORY_CARE_PROVIDER_SITE_OTHER): Payer: Self-pay

## 2024-02-20 DIAGNOSIS — Z0000 Encounter for general adult medical examination without abnormal findings: Secondary | ICD-10-CM | POA: Diagnosis not present

## 2024-02-20 NOTE — Patient Instructions (Addendum)
 Danny Pham , Thank you for taking time to come for your Medicare Wellness Visit. I appreciate your ongoing commitment to your health goals. Please review the following plan we discussed and let me know if I can assist you in the future.   Referrals/Orders/Follow-Ups/Clinician Recommendations: NONE  This is a list of the screening recommended for you and due dates:  Health Maintenance  Topic Date Due   COVID-19 Vaccine (1) Never done   HIV Screening  Never done   Zoster (Shingles) Vaccine (1 of 2) Never done   Colon Cancer Screening  Never done   Stool Blood Test  12/27/2018   DTaP/Tdap/Td vaccine (2 - Td or Tdap) 02/13/2023   Flu Shot  06/21/2024   Medicare Annual Wellness Visit  02/19/2025   Hepatitis C Screening  Completed   HPV Vaccine  Aged Out    Advanced directives: (ACP Link)Information on Advanced Care Planning can be found at [facility] Advance Health Care Directives Advance Health Care Directives. [email or web address]   Next Medicare Annual Wellness Visit scheduled for next year: Yes  02/25/25 @ 11:30 AM BY PHONE

## 2024-02-20 NOTE — Progress Notes (Signed)
 Subjective:   Danny Pham is a 63 y.o. who presents for a Medicare Wellness preventive visit.  Visit Complete: Virtual I connected with  Danny Pham on 02/20/24 by a audio enabled telemedicine application and verified that I am speaking with the correct person using two identifiers.  Patient Location: Home  Provider Location: Office/Clinic  I discussed the limitations of evaluation and management by telemedicine. The patient expressed understanding and agreed to proceed.  Vital Signs: Because this visit was a virtual/telehealth visit, some criteria may be missing or patient reported. Any vitals not documented were not able to be obtained and vitals that have been documented are patient reported.  VideoDeclined- This patient declined Librarian, academic. Therefore the visit was completed with audio only.  Persons Participating in Visit: Patient.  AWV Questionnaire: No: Patient Medicare AWV questionnaire was not completed prior to this visit.  Cardiac Risk Factors include: advanced age (>75men, >47 women);hypertension;male gender     Objective:    Today's Vitals   02/20/24 1509  PainSc: 3    There is no height or weight on file to calculate BMI.     02/20/2024    3:15 PM 02/15/2023    3:21 PM 02/10/2022    3:52 PM 07/02/2021   10:04 AM 06/29/2021   11:22 AM 08/28/2019    8:55 AM 12/26/2017    8:55 AM  Advanced Directives  Does Patient Have a Medical Advance Directive? No No No No No No No  Would patient like information on creating a medical advance directive? No - Patient declined  No - Patient declined No - Patient declined No - Patient declined No - Patient declined No - Patient declined    Current Medications (verified) Outpatient Encounter Medications as of 02/20/2024  Medication Sig   BELBUCA 300 MCG FILM Take 300 mcg by mouth every 12 (twelve) hours.   gabapentin (NEURONTIN) 300 MG capsule Take 300 mg by mouth 2 (two) times daily as  needed (pain).   tadalafil (CIALIS) 5 MG tablet Take 1 tablet (5 mg total) by mouth daily as needed for erectile dysfunction. TAKE 1 TO 2 TABLETS(5 TO 10 MG) BY MOUTH DAILY   tiZANidine (ZANAFLEX) 4 MG tablet Take 8 mg by mouth daily as needed for muscle spasms.   Citrulline POWD Take 1 Dose by mouth 2 (two) times daily. 1 = 1/2 teaspoon (Patient not taking: Reported on 02/20/2024)   hydrochlorothiazide (HYDRODIURIL) 25 MG tablet Take 25 mg by mouth daily. (Patient not taking: Reported on 02/20/2024)   ramipril (ALTACE) 10 MG capsule TAKE 1 CAPSULE BY MOUTH EVERY DAY (Patient not taking: Reported on 02/20/2024)   No facility-administered encounter medications on file as of 02/20/2024.    Allergies (verified) Adhesive [tape] and Phenergan [promethazine hcl]   History: Past Medical History:  Diagnosis Date   Anxiety    Back pain 2008   Hypertension    Past Surgical History:  Procedure Laterality Date   BACK SURGERY  2008   x2   HERNIA REPAIR Right 1990   Dr Laymond Purser HERNIA REPAIR Right 10/03/2017   Medium Ultra Pro mesh. Surgeon: Earline Mayotte, MD;  Location: [facility] OR[facility];  Service: General;  Laterality: Right;   INGUINAL HERNIA REPAIR Left 07/02/2021   Procedure: HERNIA REPAIR INGUINAL ADULT WITH PROSTHETIC MESH;  Surgeon: Earline Mayotte, MD;  Location: [facility] OR[facility];  Service: General;  Laterality: Left;   VASECTOMY  1986   Family History  Problem  Relation Age of Onset   Diabetes Mother        type 2    Hypertension Mother    COPD Mother    Congestive Heart Failure Mother    Diabetes Father        type 2   Hypertension Father    Dementia Father    CAD Other    Colon cancer Neg Hx    Social History   Socioeconomic History   Marital status: Married    Spouse name: Not on file   Number of children: 1   Years of education: Not on file   Highest education level: 12th grade  Occupational History   Occupation: Disabled    Comment: Due to back pain    Comment: retired   Tobacco Use   Smoking status: Never   Smokeless tobacco: Former    Types: Chew   Tobacco comments:    quit chewing over 20 years ago  Vaping Use   Vaping status: Never Used  Substance and Sexual Activity   Alcohol use: No    Alcohol/week: 0.0 standard drinks of alcohol   Drug use: No   Sexual activity: Not on file  Other Topics Concern   Not on file  Social History Narrative   Not on file   Social Drivers of Health   Financial Resource Strain: Low Risk  (02/20/2024)   Overall Financial Resource Strain (CARDIA)    Difficulty of Paying Living Expenses: Not hard at all  Food Insecurity: No Food Insecurity (02/20/2024)   Hunger Vital Sign    Worried About Running Out of Food in the Last Year: Never true    Ran Out of Food in the Last Year: Never true  Transportation Needs: No Transportation Needs (02/20/2024)   PRAPARE - Administrator, Civil Service (Medical): No    Lack of Transportation (Non-Medical): No  Physical Activity: Insufficiently Active (02/20/2024)   Exercise Vital Sign    Days of Exercise per Week: 3 days    Minutes of Exercise per Session: 30 min  Stress: No Stress Concern Present (02/20/2024)   [employer] of Occupational Health - Occupational Stress Questionnaire    Feeling of Stress : Not at all  Social Connections: Moderately Isolated (02/20/2024)   Social Connection and Isolation Panel [NHANES]    Frequency of Communication with Friends and Family: More than three times a week    Frequency of Social Gatherings with Friends and Family: Never    Attends Religious Services: Never    Database administrator or Organizations: No    Attends Engineer, structural: Never    Marital Status: Married    Tobacco Counseling Counseling given: Not Answered Tobacco comments: quit chewing over 20 years ago    Clinical Intake:  Pre-visit preparation completed: Yes  Pain : 0-10 Pain Score: 3  Pain Type: Chronic pain Pain Location: Back Pain  Orientation: Lower Pain Radiating Towards: GOES TO PAIN MGMT EVERY 2 MONTHS Pain Descriptors / Indicators: Constant, Throbbing, Aching Pain Onset: More than a month ago Pain Frequency: Constant     BMI - recorded: 26.6 Nutritional Status: BMI 25 -29 Overweight Nutritional Risks: None Diabetes: No  Lab Results  Component Value Date   HGBA1C 6.1 (H) 12/26/2017   HGBA1C 5.7 (H) 10/18/2016   HGBA1C 6.0 (H) 09/22/2015     How often do you need to have someone help you when you read instructions, pamphlets, or other written materials from your doctor  or pharmacy?: 1 - Never  Interpreter Needed?: No  Information entered by :: Kennedy Bucker, LPN   Activities of Daily Living     02/20/2024    3:15 PM  In your present state of health, do you have any difficulty performing the following activities:  Hearing? 0  Vision? 0  Difficulty concentrating or making decisions? 0  Walking or climbing stairs? 1  Comment BACK INJURY  Dressing or bathing? 0  Doing errands, shopping? 0  Preparing Food and eating ? N  Using the Toilet? N  In the past six months, have you accidently leaked urine? N  Do you have problems with loss of bowel control? N  Managing your Medications? N  Managing your Finances? N  Housekeeping or managing your Housekeeping? Y    Patient Care Team: Malva Limes, MD as PCP - General (Family Medicine) Kizzie Bane as Physician Assistant (Physician Assistant) Lemar Livings Merrily Pew, MD as Consulting Physician (General Surgery)  Indicate any recent Medical Services you may have received from other than [facility] providers in the past year (date may be approximate).     Assessment:   This is a routine wellness examination for Rakwon.  Hearing/Vision screen Hearing Screening - Comments:: NO AIDS Vision Screening - Comments:: WEARS GLASSES ALL THE TIME- NICE IN [facility]   Goals Addressed             This Visit's Progress    DIET - INCREASE WATER INTAKE          Depression Screen     02/20/2024    3:13 PM 02/15/2023    3:18 PM 02/10/2022    3:46 PM 06/04/2021    3:41 PM 08/28/2019    8:55 AM 12/26/2017    9:06 AM 12/26/2017    8:58 AM  PHQ 2/9 Scores  PHQ - 2 Score 0 0 0 0 0  0  PHQ- 9 Score 0        Exception Documentation      Patient refusal     Fall Risk     02/20/2024    3:15 PM 02/15/2023    3:14 PM 02/10/2022    3:53 PM 12/03/2019    8:41 AM 08/28/2019    8:55 AM  Fall Risk   Falls in the past year? 0 0 0 0 0  Number falls in past yr: 0 0 0 0 0  Injury with Fall? 0 0 0 0 0  Risk for fall due to : No Fall Risks No Fall Risks No Fall Risks    Follow up Falls prevention discussed;Falls evaluation completed Education provided;Falls prevention discussed Falls evaluation completed      MEDICARE RISK AT HOME:  Medicare Risk at Home Any stairs in or around the home?: Yes If so, are there any without handrails?: No Home free of loose throw rugs in walkways, pet beds, electrical cords, etc?: Yes Adequate lighting in your home to reduce risk of falls?: Yes Life alert?: No Use of a cane, walker or w/c?: Yes (CANE OCCASIONALLY) Grab bars in the bathroom?: Yes Shower chair or bench in shower?: Yes Elevated toilet seat or a handicapped toilet?: No  TIMED UP AND GO:  Was the test performed?  No  Cognitive Function: PT REFUSED TO DO TESTING HE IS A & O X 3        02/15/2023    3:24 PM  6CIT Screen  What Year? 0 points  What month? 0 points  What time?  0 points  Count back from 20 0 points  Months in reverse 0 points  Repeat phrase 2 points  Total Score 2 points    Immunizations Immunization History  Administered Date(s) Administered   Tdap 02/12/2013    Screening Tests Health Maintenance  Topic Date Due   COVID-19 Vaccine (1) Never done   HIV Screening  Never done   Zoster Vaccines- Shingrix (1 of 2) Never done   Colonoscopy  Never done   COLON CANCER SCREENING ANNUAL FOBT  12/27/2018   DTaP/Tdap/Td (2 - Td or  Tdap) 02/13/2023   INFLUENZA VACCINE  06/21/2024   Medicare Annual Wellness (AWV)  02/19/2025   Hepatitis C Screening  Completed   HPV VACCINES  Aged Out    Health Maintenance  Health Maintenance Due  Topic Date Due   COVID-19 Vaccine (1) Never done   HIV Screening  Never done   Zoster Vaccines- Shingrix (1 of 2) Never done   Colonoscopy  Never done   COLON CANCER SCREENING ANNUAL FOBT  12/27/2018   DTaP/Tdap/Td (2 - Td or Tdap) 02/13/2023   Health Maintenance Items Addressed: DECLINES REFERRAL FOR COLONOSCOPY DOESN'T TAKE SHOTS  Additional Screening:  Vision Screening: Recommended annual ophthalmology exams for early detection of glaucoma and other disorders of the eye.  Dental Screening: Recommended annual dental exams for proper oral hygiene  Community Resource Referral / Chronic Care Management: CRR required this visit?  No   CCM required this visit?  No     Plan:     I have personally reviewed and noted the following in the patient's chart:   Medical and social history Use of alcohol, tobacco or illicit drugs  Current medications and supplements including opioid prescriptions. Patient is not currently taking opioid prescriptions. Functional ability and status Nutritional status Physical activity Advanced directives List of other physicians Hospitalizations, surgeries, and ER visits in previous 12 months Vitals Screenings to include cognitive, depression, and falls Referrals and appointments  In addition, I have reviewed and discussed with patient certain preventive protocols, quality metrics, and best practice recommendations. A written personalized care plan for preventive services as well as general preventive health recommendations were provided to patient.     Hal Hope, LPN   03/22/8412   After Visit Summary: (MyChart) Due to this being a telephonic visit, the after visit summary with patients personalized plan was offered to patient via  MyChart   Notes: Nothing significant to report at this time.

## 2024-11-08 NOTE — Progress Notes (Signed)
 Danny Pham                                          MRN: [id number]   11/08/2024   The [facility] Quality Team Specialist reviewed this patient medical record for the purposes of chart review for care gap closure. The following were reviewed: chart review for care gap closure-colorectal cancer screening.    [facility] Quality Team

## 2024-12-03 ENCOUNTER — Other Ambulatory Visit: Payer: Self-pay | Admitting: Family Medicine

## 2025-02-25 ENCOUNTER — Ambulatory Visit
